# Patient Record
Sex: Male | Born: 1980 | Race: White | Hispanic: No | Marital: Married | Smoking: Former smoker
Health system: Southern US, Community
[De-identification: ages and names within clinical notes are randomized; demographics above are authoritative.]

## PROBLEM LIST (undated history)

## (undated) DIAGNOSIS — F419 Anxiety disorder, unspecified: Secondary | ICD-10-CM

## (undated) DIAGNOSIS — T7840XA Allergy, unspecified, initial encounter: Secondary | ICD-10-CM

## (undated) DIAGNOSIS — J45909 Unspecified asthma, uncomplicated: Secondary | ICD-10-CM

## (undated) DIAGNOSIS — K219 Gastro-esophageal reflux disease without esophagitis: Secondary | ICD-10-CM

## (undated) DIAGNOSIS — Z973 Presence of spectacles and contact lenses: Secondary | ICD-10-CM

## (undated) HISTORY — DX: Anxiety disorder, unspecified: F41.9

## (undated) HISTORY — DX: Allergy, unspecified, initial encounter: T78.40XA

## (undated) HISTORY — DX: Unspecified asthma, uncomplicated: J45.909

---

## 2015-03-08 ENCOUNTER — Ambulatory Visit (INDEPENDENT_AMBULATORY_CARE_PROVIDER_SITE_OTHER): Payer: BLUE CROSS/BLUE SHIELD | Admitting: Family Medicine

## 2015-03-08 ENCOUNTER — Encounter: Payer: Self-pay | Admitting: Family Medicine

## 2015-03-08 VITALS — BP 124/85 | HR 77 | Temp 98.2°F | Ht 71.2 in | Wt 224.9 lb

## 2015-03-08 DIAGNOSIS — F418 Other specified anxiety disorders: Secondary | ICD-10-CM

## 2015-03-08 MED ORDER — CLONAZEPAM 0.5 MG PO TABS: 0.5000 mg | ORAL_TABLET | Freq: Every day | ORAL | Status: DC | PRN

## 2015-03-08 NOTE — Progress Notes (Signed)
BP 124/85 mmHg  Pulse 77  Temp(Src) 98.2 F (36.8 C)  Ht 5' 11.2" (1.808 m)  Wt 224 lb 14.4 oz (102.014 kg)  BMI 31.21 kg/m2  SpO2 98%   Subjective:    Patient ID: Robert Castaneda, male    DOB: 1981-02-21, 33 y.o.   MRN: [id number]  HPI: Robert Castaneda is a 34 y.o. male  Chief Complaint  Patient presents with  . 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, usually about 1x a month Anxious mood: no  Excessive worrying: yes Irritability: yes  Sweating: no Nausea: no Palpitations:no Hyperventilation: no Panic attacks: no Agoraphobia: no  Obscessions/compulsions: no Depressed mood: no GAD7: 7 Anhedonia: no Weight changes: no Insomnia: no   Hypersomnia: no Fatigue/loss of energy: no Feelings of worthlessness: no Feelings of guilt: no Impaired concentration/indecisiveness: no Suicidal ideations: no  Crying spells: no Recent Stressors/Life Changes: yes   Relationship problems: no   Family stress: no     Financial stress: no    Job stress: yes    Recent death/loss: no  Relevant past medical, surgical, family and social history reviewed and updated as indicated. Interim medical history since our last visit reviewed. Allergies and medications reviewed and updated.  Review of Systems  Constitutional: Negative.   Respiratory: Negative.   Cardiovascular: Negative.   Gastrointestinal: Negative.   Psychiatric/Behavioral: Negative.    Per HPI unless specifically indicated above    Objective:    BP 124/85 mmHg  Pulse 77  Temp(Src) 98.2 F (36.8 C)  Ht 5' 11.2" (1.808 m)  Wt 224 lb 14.4 oz (102.014 kg)  BMI 31.21 kg/m2  SpO2 98%  Wt Readings from Last 3 Encounters:  03/08/15 224 lb 14.4 oz (102.014 kg)  03/07/15 209 lb (94.802 kg)    Physical Exam   Constitutional: He is oriented to person, place, and time. He appears well-developed and well-nourished. No distress.  HENT:  Head: Normocephalic and atraumatic.  Right Ear: Hearing normal.  Left Ear: Hearing normal.  Nose: Nose normal.  Eyes: Conjunctivae and lids are normal. Right eye exhibits no discharge. Left eye exhibits no discharge. No scleral icterus.  Cardiovascular: Normal rate, regular rhythm and normal heart sounds.  Exam reveals no gallop and no friction rub.   No murmur heard. Pulmonary/Chest: Effort normal and breath sounds normal. No respiratory distress. He has no wheezes. He has no rales. He exhibits no tenderness.  Musculoskeletal: Normal range of motion.  Neurological: He is alert and oriented to person, place, and time.  Skin: Skin is warm, dry and intact. No rash noted. No erythema. No pallor.  Psychiatric: He has a normal mood and affect. His speech is normal and behavior is normal. Judgment and thought content normal. Cognition and memory are normal.   No results found for this or any previous visit.    Assessment & Plan:   Problem List Items Addressed This Visit      Other   Situational anxiety - Primary    Using his klonopin very appropriately. Taking 0.25mg  about 1x a month and 30 pills lasted him almost 30 months. Refill given today. Continue to monitor. Continue current regimen. Due for PE- to have it done with [facility] PE.           Follow up plan: Return ASAP  for FAA PE and regular PE with [facility].

## 2015-03-08 NOTE — Assessment & Plan Note (Signed)
Using his klonopin very appropriately. Taking 0.25mg  about 1x a month and 30 pills lasted him almost 30 months. Refill given today. Continue to monitor. Continue current regimen. Due for PE- to have it done with [facility] PE.

## 2015-06-30 ENCOUNTER — Encounter: Payer: Self-pay | Admitting: Family Medicine

## 2015-07-18 ENCOUNTER — Ambulatory Visit (INDEPENDENT_AMBULATORY_CARE_PROVIDER_SITE_OTHER): Payer: BLUE CROSS/BLUE SHIELD | Admitting: Family Medicine

## 2015-07-18 ENCOUNTER — Encounter: Payer: Self-pay | Admitting: Family Medicine

## 2015-07-18 VITALS — BP 137/86 | HR 63 | Temp 98.3°F | Ht 71.0 in | Wt 230.0 lb

## 2015-07-18 DIAGNOSIS — H6982 Other specified disorders of Eustachian tube, left ear: Secondary | ICD-10-CM | POA: Diagnosis not present

## 2015-07-18 MED ORDER — PREDNISONE 10 MG PO TABS: 30.0000 mg | ORAL_TABLET | Freq: Every day | ORAL | Status: DC

## 2015-07-18 MED ORDER — FLUTICASONE PROPIONATE 50 MCG/ACT NA SUSP: 2.0000 | Freq: Every day | NASAL | Status: DC

## 2015-07-18 NOTE — Progress Notes (Signed)
BP 137/86 mmHg  Pulse 63  Temp(Src) 98.3 F (36.8 C)  Ht  (1.803 m)  Wt 230 lb (104.327 kg)  BMI 32.09 kg/m2  SpO2 99%   Subjective:    Patient ID: Robert Castaneda, male    DOB: 1980/08/17, 34 y.o.   MRN: [id number]  HPI: Robert Castaneda is a 34 y.o. male  Chief Complaint  Patient presents with  . ear congestion    Left ear, patient states that his ear feels congested, no also has some head congestion   EAG CLOGGED- 3rd time in 5 weeks, feels like a pressure in the L side of his head. Seems to go away after a couple of days, but then comes back. Starts with a buzzing for 12 hours, then feels stuffy and full like cotton balls, then gets really sensitive to sound, then gets really dizzy then goes away the next day. Duration: 5 weeks off and on. Lasts about 3-4 days when it comes on Involved ear(s): yes left Sensation of feeling clogged/plugged: yes Decreased/muffled hearing:yes Ear pain: no Fever: no Otorrhea: no Hearing loss: no Upper respiratory infection symptoms: no Using Q-Tips: no Status: better History of cerumenosis: no Treatments attempted: pseudoephedrine  Relevant past medical, surgical, family and social history reviewed and updated as indicated. Interim medical history since our last visit reviewed. Allergies and medications reviewed and updated.  Review of Systems  Constitutional: Negative.   HENT: Negative.   Respiratory: Negative.   Psychiatric/Behavioral: Negative.     Per HPI unless specifically indicated above     Objective:    BP 137/86 mmHg  Pulse 63  Temp(Src) 98.3 F (36.8 C)  Ht  (1.803 m)  Wt 230 lb (104.327 kg)  BMI 32.09 kg/m2  SpO2 99%  Wt Readings from Last 3 Encounters:  07/18/15 230 lb (104.327 kg)  03/08/15 224 lb 14.4 oz (102.014 kg)  03/07/15 209 lb (94.802 kg)    Physical Exam  Constitutional: He is oriented to person, place, and time. He appears well-developed and well-nourished. No distress.  HENT:   Head: Normocephalic and atraumatic.  Right Ear: Hearing, tympanic membrane, external ear and ear canal normal.  Left Ear: Hearing, external ear and ear canal normal. No lacerations. No drainage, swelling or tenderness. No foreign bodies. No mastoid tenderness. Tympanic membrane is scarred and retracted. Tympanic membrane is not injected, not perforated, not erythematous and not bulging. Tympanic membrane mobility is normal.  No middle ear effusion. No hemotympanum. No decreased hearing is noted.  Nose: Mucosal edema present. No rhinorrhea, nose lacerations, sinus tenderness, nasal deformity, septal deviation or nasal septal hematoma. No epistaxis.  No foreign bodies.  Mouth/Throat: Uvula is midline and mucous membranes are normal. Posterior oropharyngeal edema present. No oropharyngeal exudate, posterior oropharyngeal erythema or tonsillar abscesses.  Eyes: Conjunctivae and lids are normal. Pupils are equal, round, and reactive to light. Right eye exhibits no discharge. Left eye exhibits no discharge. No scleral icterus. Right eye exhibits normal extraocular motion. Left eye exhibits normal extraocular motion.  Bilateral nystagmus with L horizontal gaze  Pulmonary/Chest: Effort normal. No respiratory distress.  Musculoskeletal: Normal range of motion.  Neurological: He is alert and oriented to person, place, and time.  Skin: Skin is intact. No rash noted.  Psychiatric: He has a normal mood and affect. His speech is normal and behavior is normal. Judgment and thought content normal. Cognition and memory are normal.  Nursing note and vitals reviewed.   No results found for this or any  previous visit.    Assessment & Plan:   Problem List Items Addressed This Visit    None    Visit Diagnoses    ETD (eustachian tube dysfunction), left    -  Primary    Will start flonase. Prednisone burst if it happens again. Call if not getting better. Epley's manuver due to slight nystagmus. Monitor.          Follow up plan: Return if symptoms worsen or fail to improve.

## 2015-07-18 NOTE — Patient Instructions (Signed)
Epley Maneuver Self-Care  WHAT IS THE EPLEY MANEUVER?  The Epley maneuver is an exercise you can do to relieve symptoms of benign paroxysmal positional vertigo (BPPV). This condition is often just referred to as vertigo. BPPV is caused by the movement of tiny crystals (canaliths) inside your inner ear. The accumulation and movement of canaliths in your inner ear causes a sudden spinning sensation (vertigo) when you move your head to certain positions. Vertigo usually lasts about 30 seconds. BPPV usually occurs in just one ear. If you get vertigo when you lie on your left side, you probably have BPPV in your left ear. Your health care provider can tell you which ear is involved.   BPPV may be caused by a head injury. Many people older than 50 get BPPV for unknown reasons. If you have been diagnosed with BPPV, your health care provider may teach you how to do this maneuver. BPPV is not life threatening (benign) and usually goes away in time.   WHEN SHOULD I PERFORM THE EPLEY MANEUVER?  You can do this maneuver at home whenever you have symptoms of vertigo. You may do the Epley maneuver up to 3 times a day until your symptoms of vertigo go away.  HOW SHOULD I DO THE EPLEY MANEUVER?  1. Sit on the edge of a bed or table with your back straight. Your legs should be extended or hanging over the edge of the bed or table.    2. Turn your head halfway toward the affected ear.    3. Lie backward quickly with your head turned until you are lying flat on your back. You may want to position a pillow under your shoulders.    4. Hold this position for 30 seconds. You may experience an attack of vertigo. This is normal. Hold this position until the vertigo stops.  5. Then turn your head to the opposite direction until your unaffected ear is facing the floor.    6. Hold this position for 30 seconds. You may experience an attack of vertigo. This is normal. Hold this position until the vertigo stops.  7. Now turn your whole body to  the same side as your head. Hold for another 30 seconds.    8. You can then sit back up.  ARE THERE RISKS TO THIS MANEUVER?  In some cases, you may have other symptoms (such as changes in your vision, weakness, or numbness). If you have these symptoms, stop doing the maneuver and call your health care provider. Even if doing these maneuvers relieves your vertigo, you may still have dizziness. Dizziness is the sensation of light-headedness but without the sensation of movement. Even though the Epley maneuver may relieve your vertigo, it is possible that your symptoms will return within 5 years.  WHAT SHOULD I DO AFTER THIS MANEUVER?  After doing the Epley maneuver, you can return to your normal activities. Ask your doctor if there is anything you should do at home to prevent vertigo. This may include:  · Sleeping with two or more pillows to keep your head elevated.  · Not sleeping on the side of your affected ear.  · Getting up slowly from bed.  · Avoiding sudden movements during the day.  · Avoiding extreme head movement, like looking up or bending over.  · Wearing a cervical collar to prevent sudden head movements.  WHAT SHOULD I DO IF MY SYMPTOMS GET WORSE?  Call your health care provider if your vertigo gets worse. Call your provider right way if   you have other symptoms, including:   · Nausea.  · Vomiting.  · Headache.  · Weakness.  · Numbness.  · Vision changes.     This information is not intended to replace advice given to you by your health care provider. Make sure you discuss any questions you have with your health care provider.     Document Released: 08/03/2013 Document Reviewed: 08/03/2013  Elsevier Interactive Patient Education ©2016 [employer]

## 2015-08-09 ENCOUNTER — Telehealth: Payer: Self-pay | Admitting: Family Medicine

## 2015-08-09 NOTE — Telephone Encounter (Signed)
Pt called stated he would like a call back from Dr. Laural BenesJohnson to follow up from his last appt. Please call him ASAP. Thanks.

## 2015-08-10 NOTE — Telephone Encounter (Signed)
Called LVM for patient to return my call if he stills needs something.

## 2015-08-11 NOTE — Telephone Encounter (Signed)
Please call patient, he would like to discuss his last appointment with you.  Please call at the end of the day

## 2015-08-11 NOTE — Telephone Encounter (Signed)
Called patient. No answer. Will call back in a little bit. LMOM.

## 2015-08-11 NOTE — Telephone Encounter (Signed)
Called patient. He notes that he had a little bit of congestion that came back. Took the prednisone and it kicked it out. He notes that it cleared up and didn't cause the problems that it had done again. Discussed ETD with patient. Patient states that the prednisone made him feel really great. Felt normal and felt like that he misses it. Would like to have his CBC, TSH, testosterone checked next time he comes in. Will call him in 2 weeks to schedule follow up appointment [place]

## 2015-08-30 ENCOUNTER — Encounter: Payer: BLUE CROSS/BLUE SHIELD | Admitting: Family Medicine

## 2015-09-19 ENCOUNTER — Ambulatory Visit (INDEPENDENT_AMBULATORY_CARE_PROVIDER_SITE_OTHER): Payer: BLUE CROSS/BLUE SHIELD | Admitting: Family Medicine

## 2015-09-19 ENCOUNTER — Encounter: Payer: Self-pay | Admitting: Family Medicine

## 2015-09-19 VITALS — BP 143/89 | HR 93 | Temp 99.9°F | Wt 234.0 lb

## 2015-09-19 DIAGNOSIS — R1030 Lower abdominal pain, unspecified: Secondary | ICD-10-CM | POA: Diagnosis not present

## 2015-09-19 LAB — UA/M W/RFLX CULTURE, ROUTINE
Bilirubin, UA: NEGATIVE
Glucose, UA: NEGATIVE
KETONES UA: NEGATIVE
Leukocytes, UA: NEGATIVE
NITRITE UA: NEGATIVE
Protein, UA: NEGATIVE
RBC, UA: NEGATIVE
UUROB: 0.2 mg/dL (ref 0.2–1.0)
pH, UA: 6.5 (ref 5.0–7.5)

## 2015-09-19 NOTE — Progress Notes (Signed)
BP 148/81 mmHg  Pulse 97  Temp(Src) 99.9 F (37.7 C)  Wt 234 lb (106.142 kg)  SpO2 100%   Subjective:    Patient ID: Robert Castaneda, male    DOB: 21-Feb-1981, 35 y.o.   MRN: [id number]  HPI: Robert Castaneda is a 35 y.o. male  Chief Complaint  Patient presents with  . Abdominal Pain    X 1 week, no vomiting or diarrhea until today, which his stool has been about half and half. He states that he did have some chills last week.   ABDOMINAL PAIN  Duration: About a week Onset: sudden Severity: moderate Quality: cramping Location:  suprapubic". "lower abdominal quadrants  Episode duration: couple seconds Radiation: no Frequency: once an hour Alleviating factors: passing gas, laying down, bowel movement, positional, advil Aggravating factors: positioning Status: better Treatments attempted: pepto bismol, imodium, tums, advil Fever: no, + chills Nausea: no Vomiting: no Weight loss: no Decreased appetite: yes Diarrhea: no Constipation: no Blood in stool: no Heartburn: no Jaundice: no Rash: no Dysuria/urinary frequency: no Hematuria: no History of sexually transmitted disease: no Recurrent NSAID use: no  Relevant past medical, surgical, family and social history reviewed and updated as indicated. Interim medical history since our last visit reviewed. Allergies and medications reviewed and updated.  Review of Systems  Constitutional: Negative.   Respiratory: Negative.   Cardiovascular: Negative.   Gastrointestinal: Positive for abdominal pain. Negative for nausea, vomiting, diarrhea, constipation, blood in stool, abdominal distention, anal bleeding and rectal pain.  Genitourinary: Negative.   Skin: Negative.   Psychiatric/Behavioral: Negative.     Per HPI unless specifically indicated above     Objective:    BP 148/81 mmHg  Pulse 97  Temp(Src) 99.9 F (37.7 C)  Wt 234 lb (106.142 kg)  SpO2 100%  Wt Readings from Last 3 Encounters:  09/19/15 234 lb  (106.142 kg)  07/18/15 230 lb (104.327 kg)  03/08/15 224 lb 14.4 oz (102.014 kg)    Physical Exam  Constitutional: He is oriented to person, place, and time. He appears well-developed and well-nourished. No distress.  HENT:  Head: Normocephalic and atraumatic.  Right Ear: Hearing normal.  Left Ear: Hearing normal.  Nose: Nose normal.  Eyes: Conjunctivae and lids are normal. Right eye exhibits no discharge. Left eye exhibits no discharge. No scleral icterus.  Cardiovascular: Normal rate, regular rhythm, normal heart sounds and intact distal pulses.  Exam reveals no gallop and no friction rub.   No murmur heard. Pulmonary/Chest: Effort normal and breath sounds normal. No respiratory distress. He has no wheezes. He has no rales. He exhibits no tenderness.  Abdominal: Soft. He exhibits no distension and no mass. Bowel sounds are increased. There is no tenderness. There is no rebound and no guarding.  Musculoskeletal: Normal range of motion.  Neurological: He is alert and oriented to person, place, and time.  Skin: Skin is intact. No rash noted.  Psychiatric: He has a normal mood and affect. His speech is normal and behavior is normal. Judgment and thought content normal. Cognition and memory are normal.  Nursing note and vitals reviewed.   No results found for this or any previous visit.    Assessment & Plan:   Problem List Items Addressed This Visit    None    Visit Diagnoses    Suprapubic pain, unspecified laterality    -  Primary    Checking UA today. Likely due to GI bug- gentle diet. Continue to monitor. Let us know if not getting  better or getting worse.     Relevant Orders    UA/M w/rflx Culture, Routine        Follow up plan: Return if symptoms worsen or fail to improve.

## 2015-09-19 NOTE — Patient Instructions (Signed)

## 2015-09-22 ENCOUNTER — Telehealth: Payer: Self-pay

## 2015-09-22 NOTE — Telephone Encounter (Signed)
Tried to call patient, he was finishing up at a meeting, he will try to call us back.

## 2015-09-22 NOTE — Telephone Encounter (Signed)
Please find out what he wants. If he would like to discuss testosterone testing, he will need an appointment.

## 2015-09-22 NOTE — Telephone Encounter (Signed)
Patient called, he would like to follow up with you regarding his appointment earlier this week.

## 2015-09-25 NOTE — Telephone Encounter (Signed)
Spoke with patient he is still having the stomach pain, but he has been taking Advil which has been keeping the pain under control.

## 2017-11-20 ENCOUNTER — Emergency Department (HOSPITAL_COMMUNITY)
Admission: EM | Admit: 2017-11-20 | Discharge: 2017-11-21 | Disposition: A | Discharge status: Home / Self Care | Payer: BLUE CROSS/BLUE SHIELD | Attending: Emergency Medicine | Admitting: Emergency Medicine

## 2017-11-20 ENCOUNTER — Encounter (HOSPITAL_COMMUNITY): Payer: Self-pay

## 2017-11-20 DIAGNOSIS — Y939 Activity, unspecified: Secondary | ICD-10-CM | POA: Diagnosis not present

## 2017-11-20 DIAGNOSIS — T18128A Food in esophagus causing other injury, initial encounter: Secondary | ICD-10-CM | POA: Insufficient documentation

## 2017-11-20 DIAGNOSIS — Z79899 Other long term (current) drug therapy: Secondary | ICD-10-CM | POA: Insufficient documentation

## 2017-11-20 DIAGNOSIS — Y999 Unspecified external cause status: Secondary | ICD-10-CM | POA: Diagnosis not present

## 2017-11-20 DIAGNOSIS — X58XXXA Exposure to other specified factors, initial encounter: Secondary | ICD-10-CM | POA: Insufficient documentation

## 2017-11-20 DIAGNOSIS — J45909 Unspecified asthma, uncomplicated: Secondary | ICD-10-CM | POA: Insufficient documentation

## 2017-11-20 DIAGNOSIS — Y929 Unspecified place or not applicable: Secondary | ICD-10-CM | POA: Insufficient documentation

## 2017-11-20 NOTE — ED Triage Notes (Signed)
Pt states that he feels like he has a piece of steak stuck in his throat, airway intact, no distress, able to swallow secretions.

## 2017-11-21 ENCOUNTER — Emergency Department (HOSPITAL_COMMUNITY): Payer: BLUE CROSS/BLUE SHIELD

## 2017-11-21 MED ORDER — GLUCAGON HCL RDNA (DIAGNOSTIC) 1 MG IJ SOLR
1.0000 mg | Freq: Once | INTRAMUSCULAR | Status: AC
  Administered 2017-11-21: 1 mg via INTRAVENOUS
  Filled 2017-11-21: qty 1

## 2017-11-21 MED ORDER — LORAZEPAM 2 MG/ML IJ SOLN
1.0000 mg | Freq: Once | INTRAMUSCULAR | Status: AC
  Administered 2017-11-21: 1 mg via INTRAVENOUS
  Filled 2017-11-21: qty 1

## 2017-11-21 NOTE — ED Provider Notes (Signed)
[facility] EMERGENCY DEPARTMENT Provider Note   CSN: [id number] Arrival date & time: 11/20/17  2259     History   Chief Complaint Chief Complaint  Patient presents with  . Swallowed Foreign Body    HPI Robert Castaneda is a 37 y.o. male.  Patient is a 37 year old male with no significant past medical history.  He presents today for evaluation of esophageal foreign body.  He states he was eating steak yesterday when he believes it became stuck in his esophagus.  He has been unable to eat or drink since.  He reports spitting his saliva into a cup.  He denies abdominal pain.  He denies any difficulty breathing.  The history is provided by the patient.  Swallowed Foreign Body  This is a new problem. The problem occurs constantly. The problem has not changed since onset.Nothing aggravates the symptoms. Nothing relieves the symptoms.    Past Medical History:  Diagnosis Date  . Anxiety    Social   . Asthma     Patient Active Problem List   Diagnosis Date Noted  . Situational anxiety 03/08/2015    History reviewed. No pertinent surgical history.      Home Medications    Prior to Admission medications   Medication Sig Start Date End Date Taking? Authorizing Provider  clonazePAM (KLONOPIN) 0.5 MG tablet Take 1 tablet (0.5 mg total) by mouth daily as needed for anxiety. 03/08/15   Johnson, Megan P, DO  fluticasone (FLONASE) 50 MCG/ACT nasal spray Place 2 sprays into both nostrils daily. 07/18/15   Dorcas Carrow, DO    Family History Family History  Problem Relation Age of Onset  . Arthritis Mother   . Diabetes Father   . Cancer Maternal Grandmother        breast  . Alzheimer's disease Paternal Grandmother   . Cancer Paternal Uncle        lung ans prostate    Social History Social History   Tobacco Use  . Smoking status: Never Smoker  . Smokeless tobacco: Never Used  Substance Use Topics  . Alcohol use: Yes    Alcohol/week: 4.8 oz   Types: 8 Cans of beer per week    Comment: on the weekends occasionally  . Drug use: No     Allergies   Apple and Carrot [daucus carota]   Review of Systems Review of Systems  All other systems reviewed and are negative.    Physical Exam Updated Vital Signs BP (!) 143/91 (BP Location: Right Arm)   Pulse 91   Temp 98.7 F (37.1 C) (Oral)   Resp 14   Ht 5\' 11"  (1.803 m)   Wt 102.1 kg (225 lb)   SpO2 100%   BMI 31.38 kg/m   Physical Exam  Constitutional: He is oriented to person, place, and time. He appears well-developed and well-nourished. No distress.  HENT:  Head: Normocephalic and atraumatic.  Mouth/Throat: Oropharynx is clear and moist.  Neck: Normal range of motion. Neck supple.  Cardiovascular: Normal rate and regular rhythm. Exam reveals no friction rub.  No murmur heard. Pulmonary/Chest: Effort normal and breath sounds normal. No respiratory distress. He has no wheezes. He has no rales.  Abdominal: Soft. Bowel sounds are normal. He exhibits no distension. There is no tenderness.  Musculoskeletal: Normal range of motion. He exhibits no edema.  Neurological: He is alert and oriented to person, place, and time. Coordination normal.  Skin: Skin is warm and dry. He is not  diaphoretic.  Nursing note and vitals reviewed.    ED Treatments / Results  Labs (all labs ordered are listed, but only abnormal results are displayed) Labs Reviewed - No data to display  EKG None  Radiology Dg Chest 2 View  Result Date: 11/21/2017 CLINICAL DATA:  Patient feels like he has a piece of steak stuck in his throat. EXAM: CHEST - 2 VIEW COMPARISON:  None. FINDINGS: The heart size and mediastinal contours are within normal limits. Dilatation of the esophagus identified. No radiopaque foreign body is noted. Both lungs are clear. The visualized skeletal structures are unremarkable. IMPRESSION: No active cardiopulmonary disease. Radiopaque foreign body. No esophageal dilatation.  Electronically Signed   By: Tollie Ethavid  Kwon M.D.   On: 11/21/2017 01:45    Procedures Procedures (including critical care time)  Medications Ordered in ED Medications  glucagon (human recombinant) (GLUCAGEN) injection 1 mg (has no administration in time range)  LORazepam (ATIVAN) injection 1 mg (has no administration in time range)     Initial Impression / Assessment and Plan / ED Course  I have reviewed the triage vital signs and the nursing notes.  Pertinent labs & imaging results that were available during my care of the patient were reviewed by me and considered in my medical decision making (see chart for details).  Patient presents with steak bolus in the esophagus.  He was given Ativan and glucagon followed by [employer] with good results.  He is now drinking without difficulty.  He feels much better.  He will be discharged, to return as needed for any problems.  Final Clinical Impressions(s) / ED Diagnoses   Final diagnoses:  None    ED Discharge Orders    None       Geoffery Lyonselo, Juwann Sherk, MD 11/21/17 [phone]

## 2017-11-21 NOTE — Discharge Instructions (Addendum)
Return to the emergency department if you experience any new or concerning symptoms. °

## 2019-11-11 ENCOUNTER — Telehealth: Payer: Self-pay

## 2019-11-11 DIAGNOSIS — F418 Other specified anxiety disorders: Secondary | ICD-10-CM

## 2019-11-11 NOTE — Telephone Encounter (Signed)
Copied from CRM [id number]. Topic: General - Other >> Nov 11, 2019  3:12 PM Fanny Bien wrote: Reason for CRM: pt called and stated that pt wife lori had transferred over to Dr Sullivan Lone and would like to know if he would take him on as a Pt as well. Please advise

## 2019-11-12 NOTE — Telephone Encounter (Signed)
Please advise 

## 2019-11-16 ENCOUNTER — Telehealth: Payer: Self-pay | Admitting: Family Medicine

## 2019-11-16 NOTE — Telephone Encounter (Signed)
Patient requesting clonazePAM (KLONOPIN) 0.5 MG tablet to hold him over until NPA with Dr. Sullivan Lone for  12/07/2019. Patient aware its been a several years since last visit but states the last time PCP prescribed has held him over until now, he's completely out, please advise  [id number] [place], [place] - [address] Phone:  [phone]  Fax:  [phone]

## 2019-11-16 NOTE — Telephone Encounter (Signed)
Patient has not been seen here in over 4 years, he is not a current patient at this office.

## 2019-11-17 NOTE — Telephone Encounter (Signed)
Appointment has been made, per patient he spoke with you and you said you would accept him.

## 2019-11-18 NOTE — Telephone Encounter (Signed)
Ok thx.

## 2019-11-22 ENCOUNTER — Other Ambulatory Visit: Payer: Self-pay | Admitting: Family Medicine

## 2019-11-22 NOTE — Telephone Encounter (Signed)
Routing to provider  

## 2019-11-22 NOTE — Telephone Encounter (Signed)
Copied from CRM [id number]. Topic: Quick Communication - Rx Refill/Question >> Nov 22, 2019  2:30 PM Dalphine Handing A wrote: Medication: clonazePAM (KLONOPIN) 0.5 MG tablet  Has the patient contacted their pharmacy? {Yes (Agent: If no, request that the patient contact the pharmacy for the refill.) (Agent: If yes, when and what did the pharmacy advise?)Contact PCP  Preferred Pharmacy (with phone number or street name): [id number] - [place], [place] [address]  Phone:  [phone] Fax:  [phone]     Agent: Please be advised that RX refills may take up to 3 business days. We ask that you follow-up with your pharmacy.

## 2019-11-23 NOTE — Telephone Encounter (Signed)
Pt called asking if Dr. Sullivan Lone would consider refilling his Klonipin for him before he sees him or if he could be worked in sooner.  He siad [facility] cannot see him until May and they will not refill it.   He was seeing Dr. Olevia Perches   CB#  [phone]

## 2019-11-23 NOTE — Telephone Encounter (Signed)
He is not a current patient. I cannot give him a refill.   FYI to [place]

## 2019-11-23 NOTE — Telephone Encounter (Signed)
Called pt back advised that he would have to reestablish and Dr Laural Benes is not accepting new pt's at the time offered to schedule with another provider in office he wants to keep his appt for 4/27 else where. Advised of 3 year policy he states that he only wants a refill

## 2019-11-23 NOTE — Telephone Encounter (Signed)
Pt is aware of the below message. Pt would like to know if dr Laural Benes can give him clonazepam enough to last until he sees dr Sullivan Lone

## 2019-11-25 MED ORDER — CLONAZEPAM 0.5 MG PO TABS: 0.5000 mg | ORAL_TABLET | Freq: Every day | ORAL | 0 refills | Status: DC | PRN

## 2019-11-25 NOTE — Addendum Note (Signed)
Addended by: Bosie Clos on: 11/25/2019 12:00 PM   Modules accepted: Orders

## 2019-11-25 NOTE — Telephone Encounter (Signed)
Patient advised.

## 2019-11-25 NOTE — Telephone Encounter (Signed)
Let patient know prescription has been sent in and I will see him in May.

## 2019-11-30 IMAGING — DX DG CHEST 2V
2 series · 2 of 2 positions shown · non-contrast
Comparison: None.

CLINICAL DATA: Patient feels like he has a piece of steak stuck in
his throat.

EXAM:
CHEST - 2 VIEW

[chest pa]
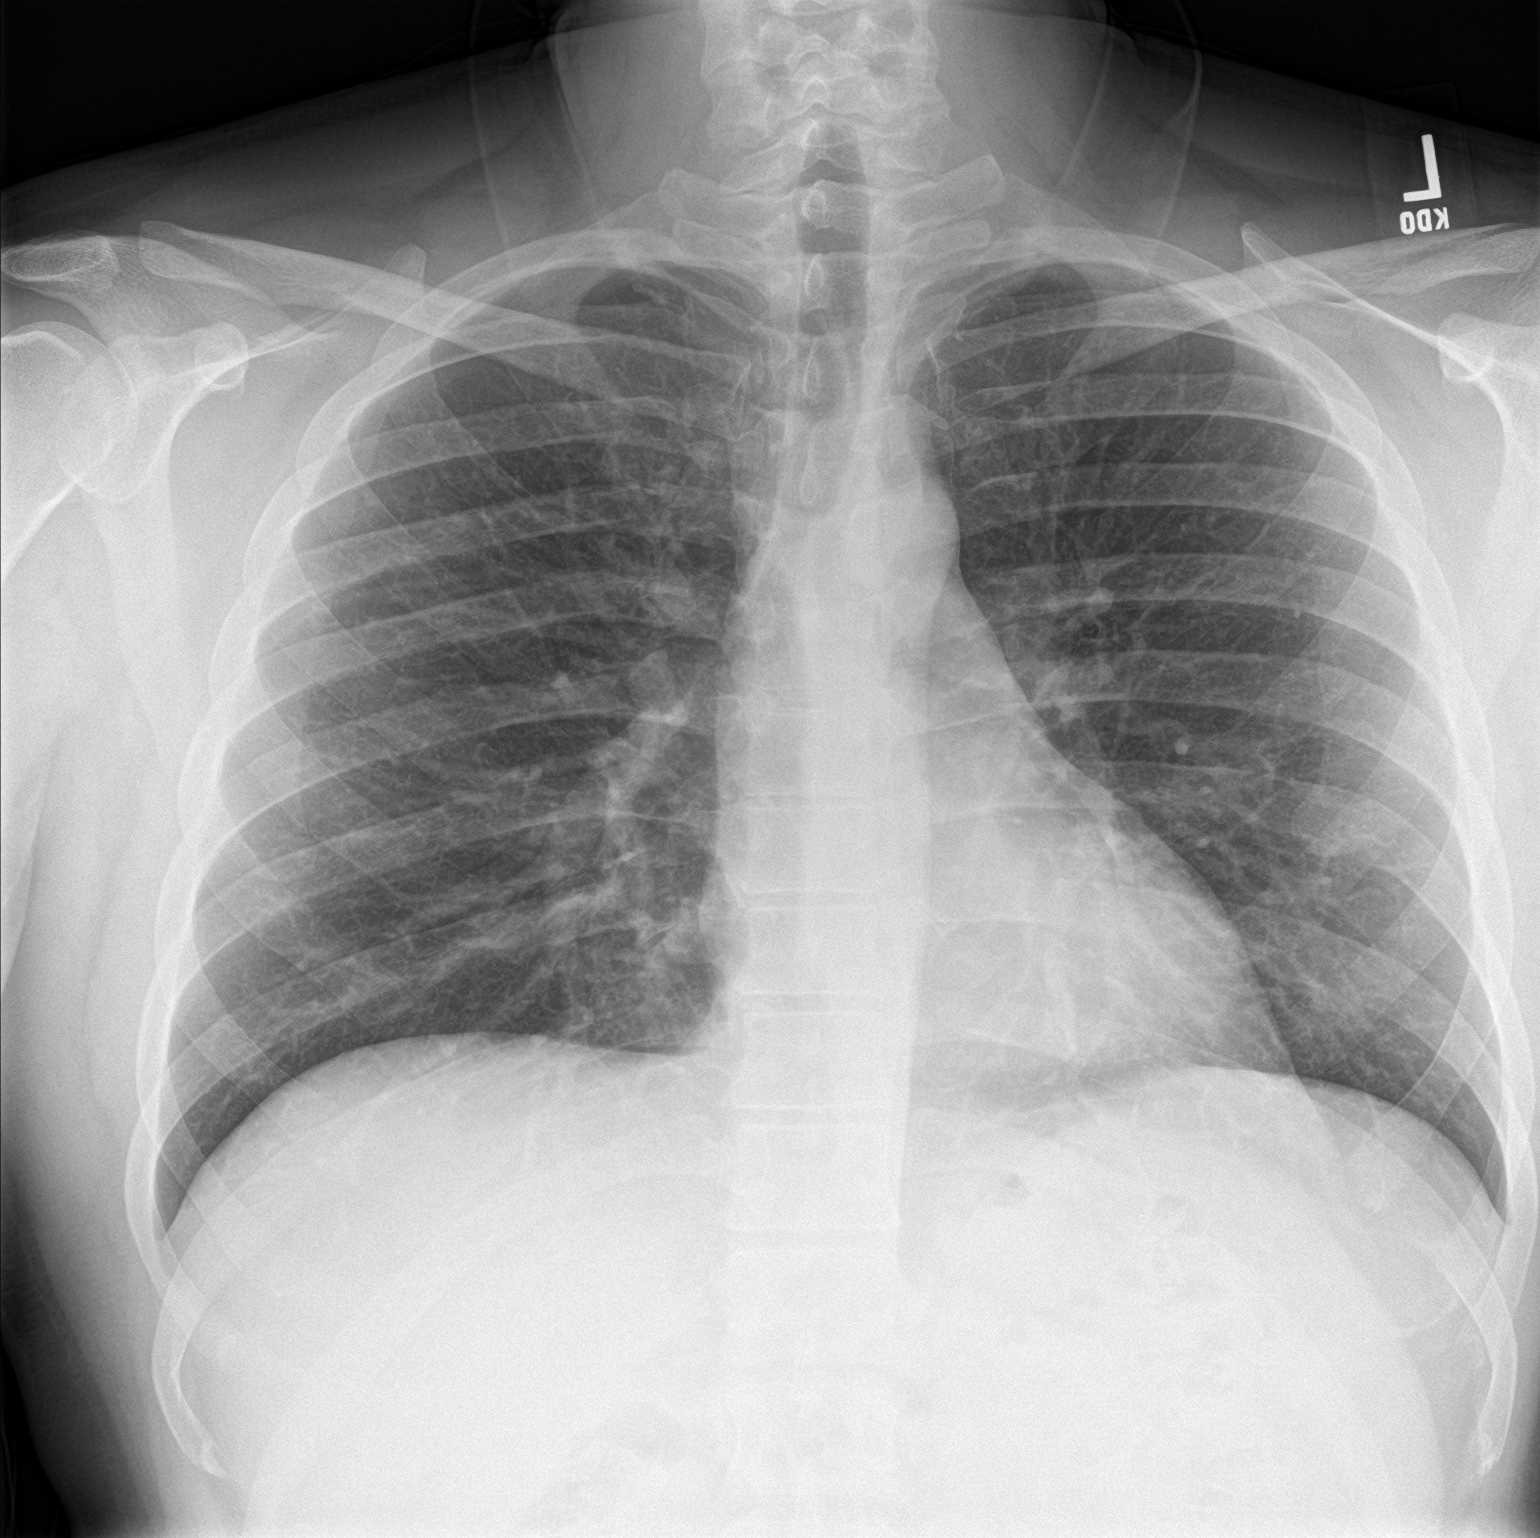

[chest lat]
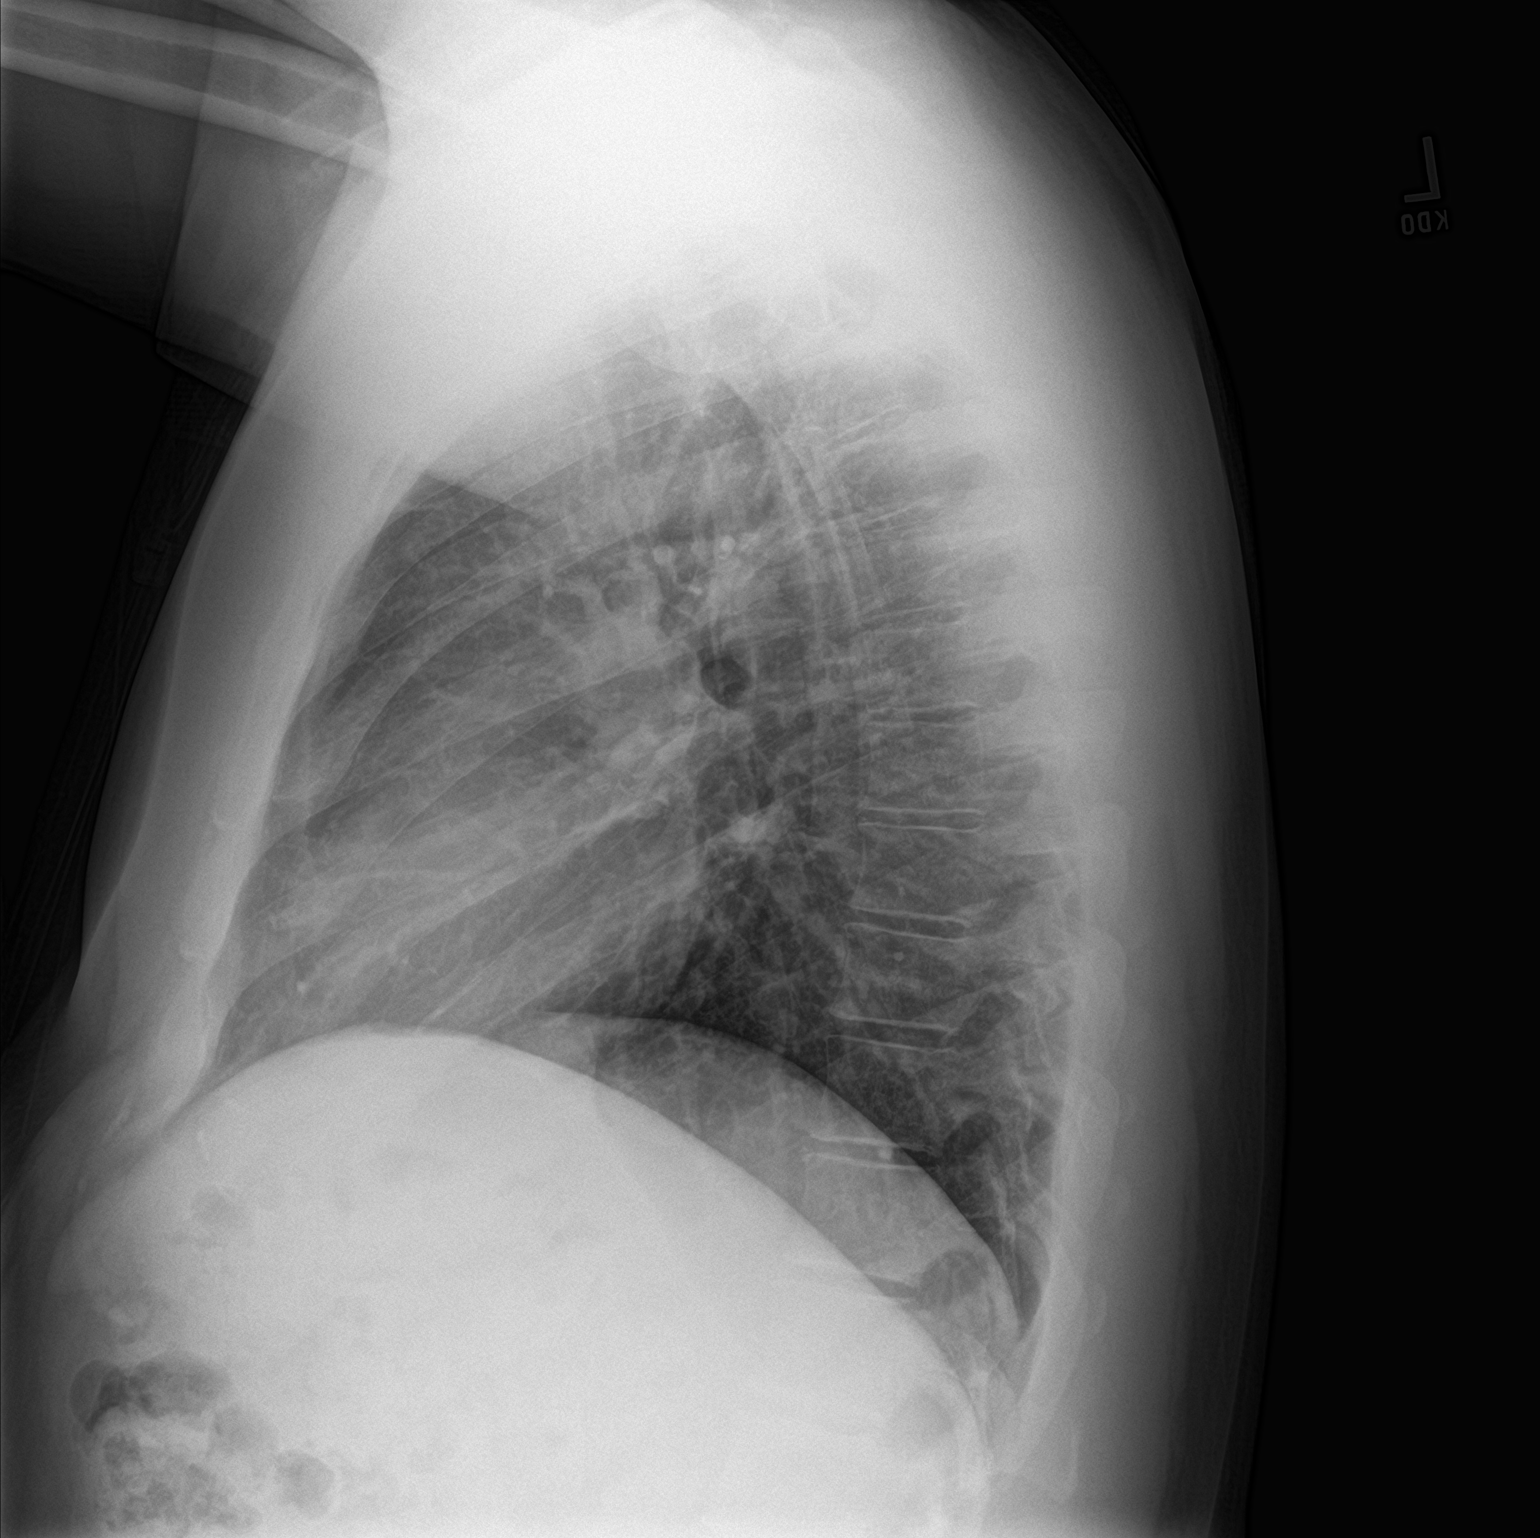

[2 of 2 positions shown; findings below may reference images not displayed]

FINDINGS: The heart size and mediastinal contours are within normal limits.
Dilatation of the esophagus identified. No radiopaque foreign body
is noted. Both lungs are clear. The visualized skeletal structures
are unremarkable.
IMPRESSION: No active cardiopulmonary disease. Radiopaque foreign body. No
esophageal dilatation.

## 2019-12-03 NOTE — Progress Notes (Signed)
New patient visit   I,April Miller,acting as a scribe for Wilhemena Durie, MD.,have documented all relevant documentation on the behalf of Wilhemena Durie, MD,as directed by  Wilhemena Durie, MD while in the presence of Wilhemena Durie, MD.   Patient: Robert Castaneda   DOB: [birth year]   39 y.o. Male  MRN: [id number] Visit Date: 12/07/2019  Today's healthcare provider: Wilhemena Durie, MD   Chief Complaint  Patient presents with  . Establish Care   Subjective    Robert Castaneda is a 39 y.o. male who presents today as a new patient to establish care.  HPI  Patient has not had an annual physical in a while.  We will perform one today. He is married and the father of 2 daughters ages 39 and 2.  He owns his own business.  He has been on the keto diet since January 1 and has lost 30 pounds with that.  He feels well.  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 At this time he does not get regular exercise.  Past Medical History:  Diagnosis Date  . Allergy   . Anxiety    Social   . Asthma    History reviewed. No pertinent surgical history. Family Status  Relation Name Status  . Mother  Alive  . Father  Alive  . Sister  Alive  . MGM  Deceased  . MGF  Deceased  . PGM  Deceased  . PGF  Deceased  . Annamarie Major  (Not Specified)   Family History  Problem Relation Age of Onset  . Arthritis Mother   . Allergies Mother   . Diabetes Father   . Cancer Maternal Grandmother        breast  . Alzheimer's disease Paternal Grandmother   . Cancer Paternal Uncle        lung ans prostate   Social History   Socioeconomic History  . Marital status: Married    Spouse name: Not on file  . Number of children: Not on file  . Years of education: Not on file  . Highest education level: Not on file  Occupational History  . Not on file  Tobacco Use  . Smoking status: Never Smoker  . Smokeless tobacco: Never Used  Substance and Sexual Activity  . Alcohol  use: Yes    Alcohol/week: 8.0 standard drinks    Types: 8 Cans of beer per week    Comment: on the weekends occasionally  . Drug use: No  . Sexual activity: Yes  Other Topics Concern  . Not on file  Social History Narrative  . Not on file   Social Determinants of Health   Financial Resource Strain:   . Difficulty of Paying Living Expenses:   Food Insecurity:   . Worried About Charity fundraiser in the Last Year:   . Arboriculturist in the Last Year:   Transportation Needs:   . Film/video editor (Medical):   Marland Kitchen Lack of Transportation (Non-Medical):   Physical Activity:   . Days of Exercise per Week:   . Minutes of Exercise per Session:   Stress:   . Feeling of Stress :   Social Connections:   . Frequency of Communication with Friends and Family:   . Frequency of Social Gatherings with Friends and Family:   . Attends Religious Services:   . Active Member of Clubs or Organizations:   .  Attends Banker Meetings:   Marland Kitchen Marital Status:    Outpatient Medications Prior to Visit  Medication Sig  . clonazePAM (KLONOPIN) 0.5 MG tablet Take 1 tablet (0.5 mg total) by mouth daily as needed for anxiety.  . fluticasone (FLONASE) 50 MCG/ACT nasal spray Place 2 sprays into both nostrils daily. (Patient not taking: Reported on 12/07/2019)   No facility-administered medications prior to visit.   Allergies  Allergen Reactions  . Apple Swelling  . Carrot [Daucus Carota]     Pain     Patient Care Team: Maple Hudson., MD as PCP - General (Family Medicine)  Review of Systems  Constitutional: Negative.   HENT: Positive for congestion, rhinorrhea and sneezing.   Eyes: Negative.   Respiratory: Positive for chest tightness.   Cardiovascular: Negative.   Gastrointestinal: Negative.   Endocrine: Negative.   Genitourinary: Negative.   Musculoskeletal: Negative.   Allergic/Immunologic: Positive for environmental allergies and food allergies.  Neurological:  Negative.   Hematological: Negative.   Psychiatric/Behavioral: The patient is nervous/anxious.        Objective    BP 134/80 (BP Location: Left Arm, Patient Position: Sitting, Cuff Size: Large)   Pulse 88   Temp (!) 97.3 F (36.3 C) (Other (Comment))   Resp 16   Ht 5\' 11"  (1.803 m)   Wt 221 lb (100.2 kg)   SpO2 97%   BMI 30.82 kg/m  Physical Exam Vitals reviewed.  HENT:     Head: Normocephalic and atraumatic.     Right Ear: Tympanic membrane and external ear normal.     Left Ear: Tympanic membrane and external ear normal.     Nose: Nose normal.     Mouth/Throat:     Mouth: Mucous membranes are moist.     Pharynx: Oropharynx is clear.  Eyes:     General: No scleral icterus.    Conjunctiva/sclera: Conjunctivae normal.     Pupils: Pupils are equal, round, and reactive to light.  Cardiovascular:     Rate and Rhythm: Normal rate and regular rhythm.     Pulses: Normal pulses.     Heart sounds: Normal heart sounds.  Pulmonary:     Effort: Pulmonary effort is normal.     Breath sounds: Normal breath sounds.  Abdominal:     Palpations: Abdomen is soft.  Genitourinary:    Penis: Normal.      Testes: Normal.  Musculoskeletal:     Right lower leg: No edema.     Left lower leg: No edema.  Lymphadenopathy:     Cervical: No cervical adenopathy.  Skin:    Findings: No lesion or rash.  Neurological:     General: No focal deficit present.     Mental Status: He is alert and oriented to person, place, and time.  Psychiatric:        Mood and Affect: Mood normal.        Behavior: Behavior normal.        Thought Content: Thought content normal.        Judgment: Judgment normal.     ECG reveals normal sinus rhythm with no ischemic changes  Results for orders placed or performed in visit on 12/07/19  POCT urinalysis dipstick  Result Value Ref Range   Color, UA Yellow    Clarity, UA Clear    Glucose, UA Negative Negative   Bilirubin, UA Negative    Ketones, UA Negative      Spec Grav, UA 1.010 1.010 -  1.025   Blood, UA Negative    pH, UA 6.0 5.0 - 8.0   Protein, UA Negative Negative   Urobilinogen, UA 0.2 0.2 or 1.0 E.U./dL   Nitrite, UA Negative    Leukocytes, UA Negative Negative   Appearance Normal    Odor Normal     Assessment & Plan     1. Annual physical exam Return in 1 year - EKG 12-Lead - POCT urinalysis dipstick--Normal. - CBC w/Diff/Platelet - Comprehensive Metabolic Panel (CMET) - TSH - Lipid panel - PSA  2. Situational anxiety Follow clinically.  Patient wishes to avoid meds. - CBC w/Diff/Platelet - Comprehensive Metabolic Panel (CMET) - TSH - Lipid panel  3. Screening for prostate cancer  - CBC w/Diff/Platelet - Comprehensive Metabolic Panel (CMET) - TSH - Lipid panel - PSA   No follow-ups on file.     I, Megan Mans, MD, have reviewed all documentation for this visit. The documentation on 12/10/19 for the exam, diagnosis, procedures, and orders are all accurate and complete.    Chauna Osoria Wendelyn Breslow, MD  [facility] [phone] (phone) [phone] (fax)  [facility]

## 2019-12-07 ENCOUNTER — Encounter: Payer: Self-pay | Admitting: Family Medicine

## 2019-12-07 ENCOUNTER — Other Ambulatory Visit: Payer: Self-pay

## 2019-12-07 ENCOUNTER — Ambulatory Visit (INDEPENDENT_AMBULATORY_CARE_PROVIDER_SITE_OTHER): Payer: BLUE CROSS/BLUE SHIELD | Admitting: Family Medicine

## 2019-12-07 VITALS — BP 134/80 | HR 88 | Temp 97.3°F | Resp 16 | Ht 71.0 in | Wt 221.0 lb

## 2019-12-07 DIAGNOSIS — Z125 Encounter for screening for malignant neoplasm of prostate: Secondary | ICD-10-CM

## 2019-12-07 DIAGNOSIS — F418 Other specified anxiety disorders: Secondary | ICD-10-CM

## 2019-12-07 DIAGNOSIS — Z0000 Encounter for general adult medical examination without abnormal findings: Secondary | ICD-10-CM

## 2019-12-07 LAB — POCT URINALYSIS DIPSTICK
Appearance: NORMAL
Bilirubin, UA: NEGATIVE
Blood, UA: NEGATIVE
Glucose, UA: NEGATIVE
Ketones, UA: NEGATIVE
Leukocytes, UA: NEGATIVE
Nitrite, UA: NEGATIVE
Odor: NORMAL
Protein, UA: NEGATIVE
Spec Grav, UA: 1.01 (ref 1.010–1.025)
Urobilinogen, UA: 0.2 E.U./dL
pH, UA: 6 (ref 5.0–8.0)

## 2019-12-10 ENCOUNTER — Telehealth: Payer: Self-pay | Admitting: Family Medicine

## 2019-12-10 DIAGNOSIS — J302 Other seasonal allergic rhinitis: Secondary | ICD-10-CM

## 2019-12-10 NOTE — Telephone Encounter (Signed)
Medication not on current list. Please advise.  

## 2019-12-10 NOTE — Telephone Encounter (Signed)
Medication Refill - Medication: Singulair   Has the patient contacted their pharmacy? Yes.  Pt states that he has run out of this medication. Please advise.  (Agent: If no, request that the patient contact the pharmacy for the refill.) (Agent: If yes, when and what did the pharmacy advise?)  Preferred Pharmacy (with phone number or street name):  [id number] [place] [place] - [address]  [address] [place] [address]  Phone: [phone] Fax: [phone]  Not a 24 hour pharmacy; exact hours not known.     Agent: Please be advised that RX refills may take up to 3 business days. We ask that you follow-up with your pharmacy.

## 2019-12-13 MED ORDER — MONTELUKAST SODIUM 10 MG PO TABS: 10.0000 mg | ORAL_TABLET | Freq: Every day | ORAL | 11 refills | Status: DC

## 2019-12-13 NOTE — Telephone Encounter (Signed)
Refill sent to pharmacy for patient.

## 2019-12-13 NOTE — Telephone Encounter (Signed)
Add to the list.  Okay to send in this with a year refill

## 2019-12-14 MED ORDER — MONTELUKAST SODIUM 10 MG PO TABS: 10.0000 mg | ORAL_TABLET | Freq: Every day | ORAL | 11 refills | Status: DC

## 2019-12-14 NOTE — Telephone Encounter (Signed)
Rx sent 

## 2019-12-14 NOTE — Telephone Encounter (Signed)
Pt is requesting rx for Singulair [facility]. Stated he is no longer in [place].  [id number] [place], [place] - [address] Phone:  [phone]  Fax:  [phone]

## 2019-12-14 NOTE — Addendum Note (Signed)
Addended by: Marlene Lard on: 12/14/2019 08:33 AM   Modules accepted: Orders

## 2019-12-24 ENCOUNTER — Other Ambulatory Visit: Payer: Self-pay | Admitting: Family Medicine

## 2019-12-24 DIAGNOSIS — F418 Other specified anxiety disorders: Secondary | ICD-10-CM

## 2019-12-24 MED ORDER — CLONAZEPAM 0.5 MG PO TABS: 0.5000 mg | ORAL_TABLET | Freq: Every day | ORAL | 0 refills | Status: DC | PRN

## 2019-12-24 NOTE — Telephone Encounter (Signed)
Copied from CRM [id number]. Topic: Quick Communication - Rx Refill/Question >> Dec 24, 2019  8:34 AM Jaquita Rector A wrote: Medication: clonazePAM (KLONOPIN) 0.5 MG tablet   Has the patient contacted their pharmacy? Yes.   (Agent: If no, request that the patient contact the pharmacy for the refill.) (Agent: If yes, when and what did the pharmacy advise?)  Preferred Pharmacy (with phone number or street name): [id number] [place], [place] [address]  Phone:  [phone] Fax:  [phone]     Agent: Please be advised that RX refills may take up to 3 business days. We ask that you follow-up with your pharmacy.

## 2019-12-24 NOTE — Telephone Encounter (Signed)
Requested medication (s) are due for refill today -yes  Requested medication (s) are on the active medication list -yes  Future visit scheduled -no  Last refill: 11/25/19  Notes to clinic: Request for non delegated Rx  Requested Prescriptions  Pending Prescriptions Disp Refills   clonazePAM (KLONOPIN) 0.5 MG tablet [Pharmacy Med Name: CLONAZEPAM 0.5 MG TABLET] 30 tablet 0    Sig: TAKE 1 TABLET BY MOUTH EVERY DAY AS NEEDED FOR ANXIETY      Not Delegated - Psychiatry:  Anxiolytics/Hypnotics Failed - 12/24/2019  8:31 AM      Failed - This refill cannot be delegated      Failed - Urine Drug Screen completed in last 360 days.      Passed - Valid encounter within last 6 months    Recent Outpatient Visits           2 weeks ago Annual physical exam   [facility] Maple Hudson., MD   4 years ago Suprapubic pain, unspecified laterality   [facility] [place], [place], DO   4 years ago ETD (eustachian tube dysfunction), left   [facility] [place], [place], DO   4 years ago Situational anxiety   [facility] [place], [place], DO                  Requested Prescriptions  Pending Prescriptions Disp Refills   clonazePAM (KLONOPIN) 0.5 MG tablet [Pharmacy Med Name: CLONAZEPAM 0.5 MG TABLET] 30 tablet 0    Sig: TAKE 1 TABLET BY MOUTH EVERY DAY AS NEEDED FOR ANXIETY      Not Delegated - Psychiatry:  Anxiolytics/Hypnotics Failed - 12/24/2019  8:31 AM      Failed - This refill cannot be delegated      Failed - Urine Drug Screen completed in last 360 days.      Passed - Valid encounter within last 6 months    Recent Outpatient Visits           2 weeks ago Annual physical exam   [facility] Maple Hudson., MD   4 years ago Suprapubic pain, unspecified laterality   [facility] [place], Megan P, DO   4 years ago ETD (eustachian tube dysfunction), left   [facility] [place],  [place], DO   4 years ago Situational anxiety   [facility] [place], [place], DO

## 2020-04-20 ENCOUNTER — Other Ambulatory Visit: Payer: Self-pay | Admitting: Family Medicine

## 2020-04-20 DIAGNOSIS — F418 Other specified anxiety disorders: Secondary | ICD-10-CM

## 2020-04-20 MED ORDER — CLONAZEPAM 0.5 MG PO TABS: 0.5000 mg | ORAL_TABLET | Freq: Every day | ORAL | 0 refills | Status: DC | PRN

## 2020-04-20 NOTE — Telephone Encounter (Signed)
Medication Refill - Medication: clonazepam  Has the patient contacted their pharmacy? No. (Agent: If no, request that the patient contact the pharmacy for the refill.) (Agent: If yes, when and what did the pharmacy advise?)  Preferred Pharmacy (with phone number or street name):[id number] [place], [place] - [address]  Agent: Please be advised that RX refills may take up to 3 business days. We ask that you follow-up with your pharmacy.

## 2020-06-22 ENCOUNTER — Other Ambulatory Visit: Payer: Self-pay | Admitting: Family Medicine

## 2020-06-22 DIAGNOSIS — F418 Other specified anxiety disorders: Secondary | ICD-10-CM

## 2020-06-22 NOTE — Telephone Encounter (Signed)
Requested medication (s) are due for refill today: yes  Requested medication (s) are on the active medication list: yes  Last refill:  04/20/20  Future visit scheduled:no  Notes to clinic:  not delegated; no valid encounter within last 6 months    Requested Prescriptions  Pending Prescriptions Disp Refills   clonazePAM (KLONOPIN) 0.5 MG tablet 30 tablet 0    Sig: Take 1 tablet (0.5 mg total) by mouth daily as needed for anxiety.      Not Delegated - Psychiatry:  Anxiolytics/Hypnotics Failed - 06/22/2020 11:16 AM      Failed - This refill cannot be delegated      Failed - Urine Drug Screen completed in last 360 days      Failed - Valid encounter within last 6 months    Recent Outpatient Visits           6 months ago Annual physical exam   [facility] [place], Richard L Jr., MD   4 years ago Suprapubic pain, unspecified laterality   [facility] [place], Megan P, DO   4 years ago ETD (eustachian tube dysfunction), left   [facility] [place], Megan P, DO   5 years ago Situational anxiety   [facility] [place], Megan P, DO                 

## 2020-06-22 NOTE — Telephone Encounter (Signed)
Copied from CRM [id number]. Topic: Quick Communication - Rx Refill/Question >> Jun 22, 2020 10:55 AM Jaquita Rector A wrote: Medication: clonazePAM (KLONOPIN) 0.5 MG tablet   Has the patient contacted their pharmacy? Yes.   (Agent: If no, request that the patient contact the pharmacy for the refill.) (Agent: If yes, when and what did the pharmacy advise?)  Preferred Pharmacy (with phone number or street name): [id number] [place], [place] [address]  Phone:  [phone] Fax:  [phone]     Agent: Please be advised that RX refills may take up to 3 business days. We ask that you follow-up with your pharmacy.

## 2020-06-22 NOTE — Telephone Encounter (Signed)
Requested medication (s) are due for refill today: yes  Requested medication (s) are on the active medication list: yes  Last refill:  04/20/20  Future visit scheduled:no  Notes to clinic:  not delegated; no valid encounter within last 6 months    Requested Prescriptions  Pending Prescriptions Disp Refills   clonazePAM (KLONOPIN) 0.5 MG tablet 30 tablet 0    Sig: Take 1 tablet (0.5 mg total) by mouth daily as needed for anxiety.      Not Delegated - Psychiatry:  Anxiolytics/Hypnotics Failed - 06/22/2020 11:16 AM      Failed - This refill cannot be delegated      Failed - Urine Drug Screen completed in last 360 days      Failed - Valid encounter within last 6 months    Recent Outpatient Visits           6 months ago Annual physical exam   [facility] Maple Hudson., MD   4 years ago Suprapubic pain, unspecified laterality   [facility] [place], Megan P, DO   4 years ago ETD (eustachian tube dysfunction), left   [facility] [place], [place], DO   5 years ago Situational anxiety   [facility] [place], [place], DO

## 2020-06-28 ENCOUNTER — Other Ambulatory Visit: Payer: Self-pay | Admitting: Family Medicine

## 2020-06-28 DIAGNOSIS — F418 Other specified anxiety disorders: Secondary | ICD-10-CM

## 2020-06-28 MED ORDER — CLONAZEPAM 0.5 MG PO TABS: 0.5000 mg | ORAL_TABLET | Freq: Every day | ORAL | 0 refills | Status: DC | PRN

## 2020-06-28 NOTE — Telephone Encounter (Signed)
Requested medication (s) are due for refill today: no  Requested medication (s) are on the active medication list: yes  Last refill:  04/20/2020  Future visit scheduled: no  Notes to clinic:  this refill cannot be delegated   Requested Prescriptions  Pending Prescriptions Disp Refills   clonazePAM (KLONOPIN) 0.5 MG tablet [Pharmacy Med Name: CLONAZEPAM 0.5 MG TABLET] 30 tablet 0    Sig: TAKE 1 TABLET BY MOUTH EVERY DAY AS NEEDED FOR ANXIETY      Not Delegated - Psychiatry:  Anxiolytics/Hypnotics Failed - 06/28/2020  9:23 AM      Failed - This refill cannot be delegated      Failed - Urine Drug Screen completed in last 360 days      Failed - Valid encounter within last 6 months    Recent Outpatient Visits           6 months ago Annual physical exam   [facility] Maple Hudson., MD   4 years ago Suprapubic pain, unspecified laterality   [facility] [place], Megan P, DO   4 years ago ETD (eustachian tube dysfunction), left   [facility] [place], [place], DO   5 years ago Situational anxiety   [facility] [place], [place], DO

## 2020-06-28 NOTE — Telephone Encounter (Signed)
Pt is calling checking on the status of  Clonazepam refill

## 2020-06-28 NOTE — Telephone Encounter (Signed)
Pharmacy calling regarding this medication. Please advise.

## 2020-11-15 ENCOUNTER — Telehealth (INDEPENDENT_AMBULATORY_CARE_PROVIDER_SITE_OTHER): Payer: BC Managed Care – PPO | Admitting: Family Medicine

## 2020-11-15 ENCOUNTER — Ambulatory Visit: Payer: BLUE CROSS/BLUE SHIELD | Admitting: Adult Health

## 2020-11-15 ENCOUNTER — Encounter: Payer: Self-pay | Admitting: Family Medicine

## 2020-11-15 DIAGNOSIS — J069 Acute upper respiratory infection, unspecified: Secondary | ICD-10-CM

## 2020-11-15 DIAGNOSIS — F418 Other specified anxiety disorders: Secondary | ICD-10-CM | POA: Diagnosis not present

## 2020-11-15 MED ORDER — PREDNISONE 20 MG PO TABS: 20.0000 mg | ORAL_TABLET | Freq: Every day | ORAL | 0 refills | Status: AC

## 2020-11-15 MED ORDER — CLONAZEPAM 0.5 MG PO TABS: 0.5000 mg | ORAL_TABLET | Freq: Every day | ORAL | 0 refills | Status: DC | PRN

## 2020-11-15 NOTE — Patient Instructions (Signed)
Viral Respiratory Infection A viral respiratory infection is an illness that affects parts of the body that are used for breathing. These include the lungs, nose, and throat. It is caused by a germ called a virus. Some examples of this kind of infection are:  A cold.  The flu (influenza).  A respiratory syncytial virus (RSV) infection. A person who gets this illness may have the following symptoms:  A stuffy or runny nose.  Yellow or green fluid in the nose.  A cough.  Sneezing.  Tiredness (fatigue).  Achy muscles.  A sore throat.  Sweating or chills.  A fever.  A headache. Follow these instructions at home: Managing pain and congestion  Take over-the-counter and prescription medicines only as told by your doctor.  If you have a sore throat, gargle with salt water. Do this 3-4 times per day or as needed. To make a salt-water mixture, dissolve -1 tsp of salt in 1 cup of warm water. Make sure that all the salt dissolves.  Use nose drops made from salt water. This helps with stuffiness (congestion). It also helps soften the skin around your nose.  Drink enough fluid to keep your pee (urine) pale yellow. General instructions  Rest as much as possible.  Do not drink alcohol.  Do not use any products that have nicotine or tobacco, such as cigarettes and e-cigarettes. If you need help quitting, ask your doctor.  Keep all follow-up visits as told by your doctor. This is important.   How is this prevented?  Get a flu shot every year. Ask your doctor when you should get your flu shot.  Do not let other people get your germs. If you are sick: ? Stay home from work or school. ? Wash your hands with soap and water often. Wash your hands after you cough or sneeze. If soap and water are not available, use hand sanitizer.  Avoid contact with people who are sick during cold and flu season. This is in fall and winter.   Get help if:  Your symptoms last for 10 days or  longer.  Your symptoms get worse over time.  You have a fever.  You have very bad pain in your face or forehead.  Parts of your jaw or neck become very swollen. Get help right away if:  You feel pain or pressure in your chest.  You have shortness of breath.  You faint or feel like you will faint.  You keep throwing up (vomiting).  You feel confused. Summary  A viral respiratory infection is an illness that affects parts of the body that are used for breathing.  Examples of this illness include a cold, the flu, and respiratory syncytial virus (RSV) infection.  The infection can cause a runny nose, cough, sneezing, sore throat, and fever.  Follow what your doctor tells you about taking medicines, drinking lots of fluid, washing your hands, resting at home, and avoiding people who are sick. This information is not intended to replace advice given to you by your health care provider. Make sure you discuss any questions you have with your health care provider. Document Revised: 08/06/2018 Document Reviewed: 09/08/2017 Elsevier Patient Education  2021 [employer]  

## 2020-11-15 NOTE — Progress Notes (Signed)
Telephone Virtual Visit    Virtual Visit via Video Note   This visit type was conducted due to national recommendations for restrictions regarding the COVID-19 Pandemic (e.g. social distancing) in an effort to limit this patient's exposure and mitigate transmission in our community. This patient is at least at moderate risk for complications without adequate follow up. This format is felt to be most appropriate for this patient at this time. Physical exam was limited by quality of the video and audio technology used for the visit.   Patient location: home with no family members present Provider location: Home office  I discussed the limitations of evaluation and management by telemedicine and the availability of in person appointments. The patient expressed understanding and agreed to proceed.  Patient: Robert Castaneda   DOB: 07/30/1981   40 y.o. Male  MRN: [id number] Visit Date: 11/15/2020  Today's healthcare provider: Azalee Course Lavone Barrientes, FNP   Chief Complaint  Patient presents with  . URI  . Anxiety   Subjective    HPI HPI    URI    Associated symptoms inlclude congestion and cough.  Recent episode started 1 to 4 weeks ago.  The problem has been unchanged since onset.  The temperature has been with in normal range.  Patient  is drinking plenty of fluids.  Patient is not a smoker.       Last edited by Fonda Kinder, CMA on 11/15/2020  9:41 AM. (History)     Patient states that he has tried taking otc Mucinex, Day/Nyquil and Claritin with no symptom relief.  Seen by Urgent care 3/25 for Viral URI Since then continues to have chest congestion Started with a head cold Has intermittent ringing of the years Lasted for a few days with a minor cough That resolved, and moved to chest Does have allergies this time of year and asthma Takes claritin thru the day as well as dayquil and mucinex Feels like something is in his chest Denies wheezing or ratteling Does have congestion  in the morning and has to work to make productive Urgent care felt sounded clear and declined xray Seemed to be getting better at that time Still having minimally productive cough Concerned about how long symptoms have been lingering and the potential for infection   Anxiety, Follow-up  He was last seen for anxiety 11 months ago. Changes made at last visit include ordering labs, continue Clonazepam PRN.   He reports excellent compliance with treatment. He reports excellent tolerance of treatment. He is not having side effects.  He feels his anxiety is mild and Improved since last visit. Needs clonazepam refills Work has been extremely busy Does not use daily, use as needed   Symptoms: No chest pain No difficulty concentrating  No dizziness No fatigue  No feelings of losing control No insomnia  No irritable No palpitations  No panic attacks No racing thoughts  No shortness of breath No sweating  No tremors/shakes    GAD-7 Results No flowsheet data found.  PHQ-9 Scores PHQ9 SCORE ONLY 12/07/2019  PHQ-9 Total Score 0    --------------------------------------------------------------------------------------------------- Patient Active Problem List   Diagnosis Date Noted  . Situational anxiety 03/08/2015   Past Medical History:  Diagnosis Date  . Allergy   . Anxiety    Social   . Asthma    Allergies  Allergen Reactions  . Apple Swelling  . Carrot [Daucus Carota]     Pain       Medications: Outpatient  Medications Prior to Visit  Medication Sig  . [DISCONTINUED] clonazePAM (KLONOPIN) 0.5 MG tablet Take 1 tablet (0.5 mg total) by mouth daily as needed for anxiety.  . fluticasone (FLONASE) 50 MCG/ACT nasal spray Place 2 sprays into both nostrils daily. (Patient not taking: No sig reported)  . montelukast (SINGULAIR) 10 MG tablet Take 1 tablet (10 mg total) by mouth daily. (Patient not taking: Reported on 11/15/2020)   No facility-administered medications prior to  visit.    Review of Systems  Constitutional: Negative for chills, fatigue and fever.  HENT: Positive for congestion and postnasal drip. Negative for sinus pressure, sinus pain and sore throat.   Respiratory: Positive for cough. Negative for chest tightness, shortness of breath and wheezing.   Cardiovascular: Negative for chest pain.  Psychiatric/Behavioral: The patient is nervous/anxious.      Physical Exam   No vital signs were done and limited physical exam due to virtual visit.  Constitutional:      General: He is not in acute distress.  Pulmonary:     Effort: Pulmonary effort is normal. No respiratory distress.  Neurological:     Mental Status: He is alert and oriented to person, place, and time.  Psychiatric:        Mood and Affect: Mood normal.        Behavior: Behavior normal.    Assessment & Plan     Problem List Items Addressed This Visit      Other   Situational anxiety   Relevant Medications   clonazePAM (KLONOPIN) 0.5 MG tablet    Other Visit Diagnoses    Viral URI with cough    -  Primary   Relevant Medications   predniSONE (DELTASONE) 20 MG tablet   Other Relevant Orders   DG Chest 2 View     Plan CXR when able, will follow up with results Continue OTC conservative treatments Prednisone x 5 days for symptome relief Clonazepam refills sent, PDMP reviewed   Return in about 3 months (around 02/14/2021), or if symptoms worsen or fail to improve.     I discussed the assessment and treatment plan with the patient. The patient was provided an opportunity to ask questions and all were answered. The patient agreed with the plan and demonstrated an understanding of the instructions.   The patient was advised to call back or seek an in-person evaluation if the symptoms worsen or if the condition fails to improve as anticipated.  I provided 20 minutes of non-face-to-face time during this encounter.   Azalee Course Anthea Udovich, FNP [facility] [phone] (phone) [phone] (fax)  [facility]

## 2020-11-16 ENCOUNTER — Encounter: Payer: Self-pay | Admitting: Family Medicine

## 2020-11-16 ENCOUNTER — Other Ambulatory Visit: Payer: Self-pay

## 2020-11-16 ENCOUNTER — Ambulatory Visit
Admission: RE | Admit: 2020-11-16 | Discharge: 2020-11-16 | Disposition: A | Discharge status: Home / Self Care | Payer: BC Managed Care – PPO | Source: Ambulatory Visit | Attending: Family Medicine | Admitting: Family Medicine

## 2020-11-16 ENCOUNTER — Telehealth: Payer: Self-pay

## 2020-11-16 DIAGNOSIS — J069 Acute upper respiratory infection, unspecified: Secondary | ICD-10-CM | POA: Insufficient documentation

## 2020-11-16 NOTE — Telephone Encounter (Signed)
Pt called asking if someone could look at his xray and call him back.  He would like someone to call him back asap. CB#  [phone]

## 2020-11-16 NOTE — Telephone Encounter (Signed)
Spoke to patient about the x-ray.  Resulsts not in yet.  Will let patient know when  They are reported

## 2020-11-17 NOTE — Telephone Encounter (Signed)
Pt called to check on imaging results/ please advise

## 2020-11-17 NOTE — Telephone Encounter (Signed)
Pt was seen by Macario Carls Just, NP. Results are not results. Will call once this has been done.

## 2020-11-17 NOTE — Telephone Encounter (Signed)
Patient is calling again to get his x-ray results.  He stated that he was told that the results would be in this morning and he has not heard back yet.  Please call patient asap with results at [phone]

## 2020-11-20 NOTE — Telephone Encounter (Signed)
Pt has already been advised.

## 2020-11-20 NOTE — Telephone Encounter (Signed)
CXR normal.

## 2020-11-20 NOTE — Telephone Encounter (Signed)
Please advise xray  

## 2020-11-21 ENCOUNTER — Telehealth: Payer: Self-pay | Admitting: Family Medicine

## 2021-02-01 ENCOUNTER — Other Ambulatory Visit: Payer: Self-pay | Admitting: Family Medicine

## 2021-02-01 DIAGNOSIS — F418 Other specified anxiety disorders: Secondary | ICD-10-CM

## 2021-02-01 NOTE — Telephone Encounter (Signed)
Medication Refill - Medication: clonazePAM (KLONOPIN) 0.5 MG tablet Pt asked if this can be sent today before he leave to go out of town tomorrow / please advise   Has the patient contacted their pharmacy? No. (Agent: If no, request that the patient contact the pharmacy for the refill.) (Agent: If yes, when and what did the pharmacy advise?)  Preferred Pharmacy (with phone number or street name): [id number] [place], [place] - [address]  [address] [place], [place] [address]  Phone:  [phone]  Fax:  [phone]   Agent: Please be advised that RX refills may take up to 3 business days. We ask that you follow-up with your pharmacy.

## 2021-02-01 NOTE — Telephone Encounter (Signed)
Requested medication (s) are due for refill today:yes   Requested medication (s) are on the active medication list:  yes  Last refill:  11/15/2020  Future visit scheduled: no  Notes to clinic: This refill cannot be delegated Patient needs today    Requested Prescriptions  Pending Prescriptions Disp Refills   clonazePAM (KLONOPIN) 0.5 MG tablet 30 tablet 0    Sig: Take 1 tablet (0.5 mg total) by mouth daily as needed for anxiety.      Not Delegated - Psychiatry:  Anxiolytics/Hypnotics Failed - 02/01/2021 12:39 PM      Failed - This refill cannot be delegated      Failed - Urine Drug Screen completed in last 360 days      Passed - Valid encounter within last 6 months    Recent Outpatient Visits           2 months ago Viral URI with cough   [facility] Just, Azalee Course, FNP   1 year ago Annual physical exam   [facility] Hudson., MD   5 years ago Suprapubic pain, unspecified laterality   [facility] [place], Megan P, DO   5 years ago ETD (eustachian tube dysfunction), left   [facility] [place], [place], DO   5 years ago Situational anxiety   [facility] [place], [place], DO

## 2021-02-05 MED ORDER — CLONAZEPAM 0.5 MG PO TABS: 0.5000 mg | ORAL_TABLET | Freq: Every day | ORAL | 0 refills | Status: DC | PRN

## 2021-02-08 ENCOUNTER — Other Ambulatory Visit: Payer: Self-pay | Admitting: Family Medicine

## 2021-02-08 DIAGNOSIS — J302 Other seasonal allergic rhinitis: Secondary | ICD-10-CM

## 2021-04-19 ENCOUNTER — Other Ambulatory Visit: Payer: Self-pay | Admitting: Family Medicine

## 2021-04-19 DIAGNOSIS — F418 Other specified anxiety disorders: Secondary | ICD-10-CM

## 2021-04-19 NOTE — Telephone Encounter (Signed)
Requested medications are on the active medication list yes  Last visit 11/2020  Future visit scheduled no  Notes to clinic Not Delegated.

## 2021-04-19 NOTE — Telephone Encounter (Signed)
Copied from CRM [id number]. Topic: Quick Communication - Rx Refill/Question >> Apr 19, 2021  3:46 PM Gaetana Michaelis A wrote: Medication: clonazePAM (KLONOPIN) 0.5 MG tablet [[id number]]   Has the patient contacted their pharmacy? No. (Agent: If no, request that the patient contact the pharmacy for the refill.) (Agent: If yes, when and what did the pharmacy advise?)  Preferred Pharmacy (with phone number or street name): [id number] [place], [place] [address]  Phone:  [phone] Fax:  [phone]  Agent: Please be advised that RX refills may take up to 3 business days. We ask that you follow-up with your pharmacy.

## 2021-04-24 MED ORDER — CLONAZEPAM 0.5 MG PO TABS: 0.5000 mg | ORAL_TABLET | Freq: Every day | ORAL | 0 refills | Status: DC | PRN

## 2021-07-06 ENCOUNTER — Other Ambulatory Visit: Payer: Self-pay | Admitting: Family Medicine

## 2021-07-06 DIAGNOSIS — F418 Other specified anxiety disorders: Secondary | ICD-10-CM

## 2021-07-09 ENCOUNTER — Other Ambulatory Visit: Payer: Self-pay | Admitting: Family Medicine

## 2021-07-09 DIAGNOSIS — F418 Other specified anxiety disorders: Secondary | ICD-10-CM

## 2021-07-09 MED ORDER — CLONAZEPAM 0.5 MG PO TABS: 0.5000 mg | ORAL_TABLET | Freq: Two times a day (BID) | ORAL | 0 refills | Status: DC | PRN

## 2021-07-09 MED ORDER — CLONAZEPAM 0.5 MG PO TABS: 0.5000 mg | ORAL_TABLET | Freq: Two times a day (BID) | ORAL | 1 refills | Status: DC | PRN

## 2021-07-09 NOTE — Telephone Encounter (Signed)
LOV: 11/15/2020  NOV: None   Last Refill:  04/24/2021  #30 0 Refills.   Thanks,   -Vernona Rieger

## 2021-10-01 ENCOUNTER — Ambulatory Visit: Payer: Self-pay

## 2021-10-01 NOTE — Telephone Encounter (Signed)
Summary: pt states got a little burn on finger last week and swelling and a little warm   Pt has called in and states just got a little burn finger several days ago and thinks getting infected, some swelling and  little warm. Fu [phone]      Chief Complaint: Burn to right middle finger Symptoms: Swelling Frequency: Started last Thursday Pertinent Negatives: Patient denies pain Disposition: [] ED /[] Urgent Care (no appt availability in office) / [] Appointment(In office/virtual)/ []  [facility]/ [] Home Care/ [] Refused Recommended Disposition /[] [facility]/ []  Follow-up with PCP Additional Notes: Pt. Wanted morning appointment. No availability. Will go to UC.  Answer Assessment - Initial Assessment Questions 1. ONSET: "When did it happen?" If happened < 3 hours ago, ask: "Did you apply cold water?" If not, give First Aid Advice immediately.      Last Thursday 2. LOCATION: "Where is the burn located?"      Right middle finger 3. BURN SIZE: "How large is the burn?"  The palm is roughly 1% of the total body surface area (BSA).     3/4 inch 4. SEVERITY OF THE BURN: "Are there any blisters?"      Open 5. MECHANISM: "Tell me how it happened."     Hot oil 6. PAIN: "Are you having any pain?" "How bad is the pain?" (Scale 1-10; or mild, moderate, severe)   - MILD (1-3): doesn't interfere with normal activities    - MODERATE (4-7): interferes with normal activities or awakens from sleep    - SEVERE (8-10): excruciating pain, unable to do any normal activities      Mild 7. INHALATION INJURY: "Were you exposed to any smoke or fumes?" If Yes, ask: "Do you have any cough or difficulty breathing?"     No 8. OTHER SYMPTOMS: "Do you have any other symptoms?" (e.g., headache, nausea)     Swelling 9. PREGNANCY: "Is there any chance you are pregnant?" "When was your last menstrual period?"     N/a  Protocols used: [facility]

## 2022-03-21 ENCOUNTER — Other Ambulatory Visit: Payer: Self-pay | Admitting: Family Medicine

## 2022-03-21 NOTE — Telephone Encounter (Signed)
Medication Refill - Medication: clonazepam 0.5 mg  Has the patient contacted their pharmacy? No.  He said he has to call Dr. Sullivan Lone. (Agent: If no, request that the patient contact the pharmacy for the refill. If patient does not wish to contact the pharmacy document the reason why and proceed with request.) (Agent: If yes, when and what did the pharmacy advise?)  Preferred Pharmacy (with phone number or street name): [facility] [address] Has the patient been seen for an appointment in the last year OR does the patient have an upcoming appointment? Yes.    Agent: Please be advised that RX refills may take up to 3 business days. We ask that you follow-up with your pharmacy.

## 2022-03-22 NOTE — Telephone Encounter (Signed)
Requested medications are due for refill today.  yes  Requested medications are on the active medications list.  yes  Last refill. 07/09/2021 #20 1 refill   Future visit scheduled.   no  Notes to clinic.  Refill not delegated. Pt over due for OV.    Requested Prescriptions  Pending Prescriptions Disp Refills   clonazePAM (KLONOPIN) 0.5 MG tablet 20 tablet 1    Sig: Take 1 tablet (0.5 mg total) by mouth 2 (two) times daily as needed for anxiety. Pt will need appointment for further refills     Not Delegated - Psychiatry: Anxiolytics/Hypnotics 2 Failed - 03/21/2022  5:34 PM      Failed - This refill cannot be delegated      Failed - Urine Drug Screen completed in last 360 days      Failed - Valid encounter within last 6 months    Recent Outpatient Visits           1 year ago Viral URI with cough   [facility] Just, Azalee Course, FNP   2 years ago Annual physical exam   [facility] Maple Hudson., MD   6 years ago Suprapubic pain, unspecified laterality   [facility] [place], Megan P, DO   6 years ago ETD (eustachian tube dysfunction), left   [facility] [place], [place], DO   7 years ago Situational anxiety   [facility] [place], [place], DO              Passed - Patient is not pregnant

## 2022-03-22 NOTE — Telephone Encounter (Signed)
Called pt - [id number] to schedule appointment.

## 2022-03-25 ENCOUNTER — Telehealth (INDEPENDENT_AMBULATORY_CARE_PROVIDER_SITE_OTHER): Payer: Self-pay | Admitting: Family Medicine

## 2022-03-25 DIAGNOSIS — F418 Other specified anxiety disorders: Secondary | ICD-10-CM

## 2022-03-25 DIAGNOSIS — J302 Other seasonal allergic rhinitis: Secondary | ICD-10-CM

## 2022-03-25 MED ORDER — CLONAZEPAM 0.5 MG PO TABS: 0.5000 mg | ORAL_TABLET | Freq: Two times a day (BID) | ORAL | 1 refills | Status: DC | PRN

## 2022-03-25 MED ORDER — MONTELUKAST SODIUM 10 MG PO TABS: 10.0000 mg | ORAL_TABLET | Freq: Every day | ORAL | 0 refills | Status: DC

## 2022-03-25 MED ORDER — FLUTICASONE PROPIONATE 50 MCG/ACT NA SUSP: 2.0000 | Freq: Every day | NASAL | 6 refills | Status: DC

## 2022-03-25 NOTE — Progress Notes (Signed)
MyChart Video Visit    Virtual Visit via Video Note   This visit type was conducted due to national recommendations for restrictions regarding the COVID-19 Pandemic (e.g. social distancing) in an effort to limit this patient's exposure and mitigate transmission in our community. This patient is at least at moderate risk for complications without adequate follow up. This format is felt to be most appropriate for this patient at this time. Physical exam was limited by quality of the video and audio technology used for the visit.   Patient location: Home Provider location: Office  I discussed the limitations of evaluation and management by telemedicine and the availability of in person appointments. The patient expressed understanding and agreed to proceed.  Patient: Robert Castaneda   DOB: [birth year]   41 y.o. Male  MRN: [id number] Visit Date: 03/25/2022  Today's healthcare provider: Megan Mans, MD   No chief complaint on file.  Subjective    HPI  Patient wants to discuss clonazepam and get refills.  I have not seen him in 2-1/2 years but he is feeling well overall.  He did not get his labs done at his physical 2 and half years ago.  He is agreeable to coming in early next year for physical.  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 Pt will need appointment for further refills   fluticasone (FLONASE) 50 MCG/ACT nasal spray Place 2 sprays into both nostrils daily. (Patient not taking: No sig reported)   montelukast (SINGULAIR) 10 MG tablet TAKE 1 TABLET BY MOUTH EVERY DAY   No facility-administered medications prior to visit.    Review of Systems      Objective    There were no vitals taken for this visit.  BP Readings from Last  3 Encounters:  12/07/19 134/80  11/21/17 133/70  09/19/15 (!) 143/89   Wt Readings from Last 3 Encounters:  12/07/19 221 lb (100.2 kg)  11/20/17 225 lb (102.1 kg)  09/19/15 234 lb (106.1 kg)       Physical Exam     Assessment & Plan     1. Seasonal allergies  - montelukast (SINGULAIR) 10 MG tablet; Take 1 tablet (10 mg total) by mouth daily.  Dispense: 90 tablet; Refill: 0 - fluticasone (FLONASE) 50 MCG/ACT nasal spray; Place 2 sprays into both nostrils daily.  Dispense: 16 g; Refill: 6  2. Situational anxiety Will need an appointment for any further refills.  He is given 30 with 1 refill.  Like to see him for a physical early next year. - clonazePAM (KLONOPIN) 0.5 MG tablet; Take 1 tablet (0.5 mg total) by mouth 2 (two) times daily as needed for anxiety.  Dispense: 30 tablet; Refill: 1   No follow-ups on file.     I discussed the assessment and treatment plan with the patient. The patient was provided an opportunity to ask questions and all were answered. The patient agreed with the plan and demonstrated an understanding of the instructions.   The patient was advised to call back or seek an in-person evaluation if the symptoms worsen or if the condition fails to improve as anticipated.  I provided 12 minutes of non-face-to-face time during this encounter.  I, Megan Mans, MD, have  reviewed all documentation for this visit. The documentation on 03/25/22 for the exam, diagnosis, procedures, and orders are all accurate and complete.   Trenise Turay Wendelyn Breslow, MD [facility] [phone] (phone) [phone] (fax)  [facility]

## 2022-07-24 ENCOUNTER — Ambulatory Visit (INDEPENDENT_AMBULATORY_CARE_PROVIDER_SITE_OTHER): Payer: BC Managed Care – PPO | Admitting: Family Medicine

## 2022-07-24 ENCOUNTER — Ambulatory Visit: Payer: Self-pay

## 2022-07-24 ENCOUNTER — Encounter: Payer: Self-pay | Admitting: Family Medicine

## 2022-07-24 VITALS — BP 132/88 | HR 105 | Temp 98.7°F | Resp 16 | Ht 72.0 in | Wt 238.8 lb

## 2022-07-24 DIAGNOSIS — R1084 Generalized abdominal pain: Secondary | ICD-10-CM

## 2022-07-24 DIAGNOSIS — R1033 Periumbilical pain: Secondary | ICD-10-CM | POA: Insufficient documentation

## 2022-07-24 NOTE — Progress Notes (Signed)
I,Robert Castaneda,acting as a scribe for [employer], MD.,have documented all relevant documentation on the behalf of Robert Ramp, MD,as directed by  Robert Ramp, MD while in the presence of Robert Ramp, MD.   Established patient visit   Patient: Robert Castaneda   DOB: [birth year]   41 y.o. Male  MRN: [id number] Visit Date: 07/24/2022  Today's healthcare provider: [name], MD   Chief Complaint  Patient presents with   Abdominal Pain   Subjective    Abdominal Pain This is a new problem. The current episode started in the past 7 days (x4 days). The onset quality is sudden. The problem occurs intermittently. The problem has been unchanged. The pain is located in the suprapubic region. The pain is at a severity of 6/10. The pain is moderate. The quality of the pain is dull and cramping. The abdominal pain does not radiate. Associated symptoms include constipation ("small stools"). Pertinent negatives include no fever or vomiting. The pain is aggravated by movement. The pain is relieved by Bowel movements. Treatments tried: Dulcolax. The treatment provided mild relief.    Patient states that symptoms started 4AM on Sunday morning that woke him from sleep and felt like cramping and after a bowel movement, the pain is lessened  Localizes pain to about the size of his hand below his navel  He denies bleeding in the stool or black stool  Pictures reviewed  He has had three stools so far this am  He has been taking the dulcolax and stools have gotten softer and volume has increased over the course of the illness  He denies feeling bloating  He reports feeling well now in the office  He reports having a new apple pie over the weekend before this started  He reports having reflux symptoms after eating raw apples or carrots    He reports having explosive diarrhea for one week after thanksgiving brine preparation  He was  the only family member who felt sick after the [place] meal  He denies hx of digestive issues  He reports that he has noticed that he has more sensitivity with age  He denies urinary symptoms and has no hematuria that I can visualize on images.  He showed of his bowel movements   Medications: Outpatient Medications Prior to Visit  Medication Sig   clonazePAM (KLONOPIN) 0.5 MG tablet Take 1 tablet (0.5 mg total) by mouth 2 (two) times daily as needed for anxiety.   fluticasone (FLONASE) 50 MCG/ACT nasal spray Place 2 sprays into both nostrils daily.   montelukast (SINGULAIR) 10 MG tablet Take 1 tablet (10 mg total) by mouth daily.   No facility-administered medications prior to visit.    Review of Systems  Constitutional:  Negative for fever.  Gastrointestinal:  Positive for abdominal pain and constipation ("small stools"). Negative for vomiting.       Objective    BP 132/88 (BP Location: Left Arm, Patient Position: Sitting, Cuff Size: Large)   Pulse (!) 105   Temp 98.7 F (37.1 C) (Oral)   Resp 16   Ht 6' (1.829 m)   Wt 238 lb 12.8 oz (108.3 kg)   BMI 32.39 kg/m    Physical Exam Constitutional:      Appearance: He is well-developed. He is not ill-appearing or diaphoretic.  Eyes:     General: No scleral icterus. Cardiovascular:     Rate and Rhythm: Normal rate and regular rhythm.  Pulmonary:     Effort: Pulmonary  effort is normal.  Abdominal:     General: Bowel sounds are normal. There is no distension.     Palpations: Abdomen is soft. There is no fluid wave, hepatomegaly or mass.     Tenderness: There is abdominal tenderness in the suprapubic area. There is no right CVA tenderness, left CVA tenderness, guarding or rebound. Negative signs include Murphy's sign, Rovsing's sign and McBurney's sign.  Skin:    Capillary Refill: Capillary refill takes less than 2 seconds.     Coloration: Skin is not jaundiced, mottled or pale.     Findings: No erythema or rash.   Neurological:     Mental Status: He is alert and oriented to person, place, and time.  Psychiatric:        Mood and Affect: Mood normal.       No results found for any visits on 07/24/22.  Assessment & Plan     Problem List Items Addressed This Visit       Other   Periumbilical abdominal pain - Primary    Suspect that patient gastrointestinal viral illness following things given and is now recovering from this Recommended over-the-counter medications to monitor his symptoms He has no red flag findings on exam such as guarding, distention, hepatomegaly, Murphy sign or Rovsing Or McBurney's point tenderness Recommended starting MiraLAX and then adding Dulcolax or senna for mild constipation Also recommended starting probiotic Discussed return precautions, see AVS Patient to return as needed        Return if symptoms worsen or fail to improve.      I, Robert Ramp, MD, have reviewed all documentation for this visit.  Portions of this information were initially documented by the CMA and reviewed by me for thoroughness and accuracy.      Robert Ramp, MD  [facility] [phone] (phone) [phone] (fax)  [facility]

## 2022-07-24 NOTE — Patient Instructions (Addendum)
I believe your symptoms are related to healing from her recent likely viral GI bug.  As we discussed today, please continue to increase fluid intake.  If you begin to have constipation, recommend starting with MiraLAX that can be taken 1-3 times daily.  If the MiraLAX is unsuccessful and allow you to have 1-2 soft and easy to pass bowel movements per day, I recommend adding the Dulcolax or senna to help increase bowel movements.  If you are having no bowel movements after 3 days or you are having 6 or more watery bowel movements per day, I recommend returning to care.  We also discussed starting a probiotic as your gut flora may need to be replenished after recovering from your illness over the Thanksgiving holiday     Probiotics Probiotics are the good bacteria and yeasts that live in your body and keep your digestive system healthy. Probiotics also help your body's defense system (immune system) and protect your body against the growth of harmful bacteria. Your health care provider may recommend taking a probiotic if you are taking antibiotic medicine or have certain medical conditions, such as: Diarrhea. Constipation. Irritable bowel syndrome. Lung infections. Yeast infections. Acne, eczema, and other skin conditions. Frequent urinary tract infections. What affects the balance of bacteria in my body? The balance of good bacteria in your body can be affected by: Antibiotics. These medicines treat infections caused by bacteria. Unfortunately, they may kill the good bacteria in your body as well as the bad bacteria. Certain medical conditions. Conditions related to an imbalance of bacteria include: Stomach and intestine (gastrointestinal) infections. Lung infections. Skin infections. Vaginal infections. Inflammatory bowel diseases. Stomach ulcers (gastric ulcers). Tooth decay and gum disease (periodontal disease). Stress. A low-fiber diet. What type of probiotic is right for  me? Probiotics contain different types of bacteria (strains). Strains commonly found in probiotics include: Lactobacillus. Saccharomyces. Bifidobacterium. Specific strains have been shown to be more effective for certain health conditions. Ask your health care provider which strain or strains you should use and how often. Probiotics come in many different forms, strain combinations, and strengths. Some may need to be refrigerated. Always read the label for storage and usage instructions. Certain foods, such as yogurt, contain probiotics. Probiotics can also be bought as a supplement at a pharmacy, health food store, or grocery store. Talk to your health care provider before starting any supplement. What are the side effects of probiotics? Some people have side effects when taking probiotics. Side effects are usually temporary and may include: Gas. Bloating. Cramping. Serious side effects are rare. Follow these instructions at home:  Eat foods high in fiber, such as whole grains, beans, and vegetables. These foods can help good bacteria grow. Avoid certain foods as told by your health care provider. If you are taking probiotics with antibiotic medicine, take your probiotics as told by your health care provider. You may have to take probiotics for several weeks. This is to help the growth of good bacteria in your gut. Where to find more information International Scientific Association for Probiotics and Prebiotics: isappscience.org Summary Probiotics are the good bacteria and yeasts that live in your body and keep you and your digestive system healthy. Certain foods, such as yogurt, contain probiotics. Probiotics can be taken as supplements. They can be bought at a pharmacy, health food store, or grocery store. They come in many different forms, strain combinations, and strengths. Be sure to talk with your health care provider before taking a probiotic supplement. This  information is not  intended to replace advice given to you by your health care provider. Make sure you discuss any questions you have with your health care provider. Document Revised: 05/16/2021 Document Reviewed: 05/16/2021 Elsevier Patient Education  2023 [employer].

## 2022-07-24 NOTE — Assessment & Plan Note (Signed)
Suspect that patient gastrointestinal viral illness following things given and is now recovering from this Recommended over-the-counter medications to monitor his symptoms He has no red flag findings on exam such as guarding, distention, hepatomegaly, Murphy sign or Rovsing Or McBurney's point tenderness Recommended starting MiraLAX and then adding Dulcolax or senna for mild constipation Also recommended starting probiotic Discussed return precautions, see AVS Patient to return as needed

## 2022-07-24 NOTE — Telephone Encounter (Signed)
  Chief Complaint: lower abd pain Symptoms: dull pain that comes and goes 6/10, 2 episodes diarrhea Frequency: Sunday Pertinent Negatives: Patient denies fever, distension, urination pain and vomiting Disposition: [] ED /[] Urgent Care (no appt availability in office) / [x] Appointment(In office/virtual)/ []  [facility]/ [] Home Care/ [] Refused Recommended Disposition /[] [place]/ []  Follow-up with PCP Additional Notes: pt took laxative and had BM plus 2 episodes diarrhea Reason for Disposition  [1] MODERATE pain (e.g., interferes with normal activities) AND [2] pain comes and goes (cramps) AND [3] present > 24 hours  (Exception: Pain with Vomiting or Diarrhea - see that Guideline.)  Answer Assessment - Initial Assessment Questions 1. LOCATION: "Where does it hurt?"      4 inches below navel 2. RADIATION: "Does the pain shoot anywhere else?" (e.g., chest, back)     no 3. ONSET: "When did the pain begin?" (Minutes, hours or days ago)      Sunday am  4. SUDDEN: "Gradual or sudden onset?"     Sudden woke pt up  5. PATTERN "Does the pain come and go, or is it constant?"    - If it comes and goes: "How long does it last?" "Do you have pain now?"     (Note: Comes and goes means the pain is intermittent. It goes away completely between bouts.)    - If constant: "Is it getting better, staying the same, or getting worse?"      (Note: Constant means the pain never goes away completely; most serious pain is constant and gets worse.)      Comes and goes- lasts for until goes to BR then ok for an hour 6. SEVERITY: "How bad is the pain?"  (e.g., Scale 1-10; mild, moderate, or severe)    - MILD (1-3): Doesn't interfere with normal activities, abdomen soft and not tender to touch.     - MODERATE (4-7): Interferes with normal activities or awakens from sleep, abdomen tender to touch.     - SEVERE (8-10): Excruciating pain, doubled over, unable to do any normal activities.       Dull  - no pain      episodes pain is 6 7. RECURRENT SYMPTOM: "Have you ever had this type of stomach pain before?" If Yes, ask: "When was the last time?" and "What happened that time?"      2 years ago 1 week gnawing pain in area 8. CAUSE: "What do you think is causing the stomach pain?"     constipation 9. RELIEVING/AGGRAVATING FACTORS: "What makes it better or worse?" (e.g., antacids, bending or twisting motion, bowel movement)     BM 10. OTHER SYMPTOMS: "Do you have any other symptoms?" (e.g., back pain, diarrhea, fever, urination pain, vomiting)       Thinner BM 2 episodes diarrhea-  Protocols used: Abdominal Pain - Male-A-AH

## 2022-07-25 ENCOUNTER — Ambulatory Visit: Payer: Self-pay | Admitting: *Deleted

## 2022-07-25 NOTE — Telephone Encounter (Signed)
  Chief Complaint: low abdominal pain continues, told to call back if no better from OV 07/24/22 Symptoms: low abdominal pain , comes and goes. Severe at times mild now. Woke up from sleep last night "soaked". Denies fever, has been drinking more water throughout day. Had small soft BM today .  Frequency: last night  Pertinent Negatives: Patient denies fever, no N/V. No diarrhea no constipation. No pain radiating to other areas. Disposition: [x] ED /[] Urgent care (no appt availability in office) / [] Appointment(In office/virtual)/ []  [facility]/ [] Home Care/ [] Refused Recommended Disposition /[] [place] [facility]/ []  Follow-up with PCP Additional Notes:   Recommended if pain becomes severe again go to ED. Please advise if appt needed. Recommended to continue water intake, drink warm liquids as tolerated. Change diet to soft bland.     Reason for Disposition  [1] MODERATE pain (e.g., interferes with normal activities) AND [2] pain comes and goes (cramps) AND [3] present > 24 hours  (Exception: Pain with Vomiting or Diarrhea - see that Guideline.)  Answer Assessment - Initial Assessment Questions 1. LOCATION: "Where does it hurt?"      Low abdominal area  2. RADIATION: "Does the pain shoot anywhere else?" (e.g., chest, back)     No  3. ONSET: "When did the pain begin?" (Minutes, hours or days ago)      On going beginning of week  4. SUDDEN: "Gradual or sudden onset?"     Na  5. PATTERN "Does the pain come and go, or is it constant?"    - If it comes and goes: "How long does it last?" "Do you have pain now?"     (Note: Comes and goes means the pain is intermittent. It goes away completely between bouts.)    - If constant: "Is it getting better, staying the same, or getting worse?"      (Note: Constant means the pain never goes away completely; most serious pain is constant and gets worse.)      Comes and goes  6. SEVERITY: "How bad is the pain?"  (e.g., Scale 1-10; mild,  moderate, or severe)    - MILD (1-3): Doesn't interfere with normal activities, abdomen soft and not tender to touch.     - MODERATE (4-7): Interferes with normal activities or awakens from sleep, abdomen tender to touch.     - SEVERE (8-10): Excruciating pain, doubled over, unable to do any normal activities.       Mild now  but can become severe at times. Did wake up at night "soaked".  7. RECURRENT SYMPTOM: "Have you ever had this type of stomach pain before?" If Yes, ask: "When was the last time?" and "What happened that time?"      Yes last seen OV 07/24/22 8. CAUSE: "What do you think is causing the stomach pain?"     Not sure seen in OV 07/23/22 possible GI bug 9. RELIEVING/AGGRAVATING FACTORS: "What makes it better or worse?" (e.g., antacids, bending or twisting motion, bowel movement)     Not sure . Has tried pepto bismol 10. OTHER SYMPTOMS: "Do you have any other symptoms?" (e.g., back pain, diarrhea, fever, urination pain, vomiting)       Low abdominal pain, no urinary issues. BM small soft .  Protocols used: Abdominal Pain - Male-A-AH

## 2022-07-26 ENCOUNTER — Encounter: Payer: Self-pay | Admitting: Family Medicine

## 2022-07-26 ENCOUNTER — Ambulatory Visit
Admission: RE | Admit: 2022-07-26 | Discharge: 2022-07-26 | Disposition: A | Discharge status: Home / Self Care | Payer: BC Managed Care – PPO | Source: Home / Self Care | Attending: Family Medicine | Admitting: Family Medicine

## 2022-07-26 ENCOUNTER — Ambulatory Visit (INDEPENDENT_AMBULATORY_CARE_PROVIDER_SITE_OTHER): Payer: BC Managed Care – PPO | Admitting: Family Medicine

## 2022-07-26 ENCOUNTER — Ambulatory Visit: Payer: BC Managed Care – PPO

## 2022-07-26 ENCOUNTER — Ambulatory Visit
Admission: RE | Admit: 2022-07-26 | Discharge: 2022-07-26 | Disposition: A | Discharge status: Home / Self Care | Payer: BC Managed Care – PPO | Source: Ambulatory Visit | Attending: Family Medicine | Admitting: Family Medicine

## 2022-07-26 ENCOUNTER — Other Ambulatory Visit: Payer: Self-pay | Admitting: Family Medicine

## 2022-07-26 ENCOUNTER — Other Ambulatory Visit (HOSPITAL_COMMUNITY)
Admission: RE | Admit: 2022-07-26 | Discharge: 2022-07-26 | Disposition: A | Discharge status: Home / Self Care | Payer: BC Managed Care – PPO | Source: Ambulatory Visit | Attending: Family Medicine | Admitting: Family Medicine

## 2022-07-26 VITALS — BP 142/85 | HR 118 | Temp 98.0°F | Resp 16 | Wt 242.0 lb

## 2022-07-26 DIAGNOSIS — R1084 Generalized abdominal pain: Secondary | ICD-10-CM | POA: Insufficient documentation

## 2022-07-26 DIAGNOSIS — R102 Pelvic and perineal pain: Secondary | ICD-10-CM | POA: Insufficient documentation

## 2022-07-26 DIAGNOSIS — K2100 Gastro-esophageal reflux disease with esophagitis, without bleeding: Secondary | ICD-10-CM

## 2022-07-26 DIAGNOSIS — E669 Obesity, unspecified: Secondary | ICD-10-CM | POA: Diagnosis not present

## 2022-07-26 DIAGNOSIS — Z114 Encounter for screening for human immunodeficiency virus [HIV]: Secondary | ICD-10-CM | POA: Insufficient documentation

## 2022-07-26 DIAGNOSIS — Z125 Encounter for screening for malignant neoplasm of prostate: Secondary | ICD-10-CM

## 2022-07-26 DIAGNOSIS — Z131 Encounter for screening for diabetes mellitus: Secondary | ICD-10-CM

## 2022-07-26 DIAGNOSIS — Z1329 Encounter for screening for other suspected endocrine disorder: Secondary | ICD-10-CM | POA: Insufficient documentation

## 2022-07-26 DIAGNOSIS — K5720 Diverticulitis of large intestine with perforation and abscess without bleeding: Secondary | ICD-10-CM | POA: Diagnosis not present

## 2022-07-26 DIAGNOSIS — R030 Elevated blood-pressure reading, without diagnosis of hypertension: Secondary | ICD-10-CM

## 2022-07-26 DIAGNOSIS — E66811 Obesity, class 1: Secondary | ICD-10-CM

## 2022-07-26 DIAGNOSIS — Z789 Other specified health status: Secondary | ICD-10-CM

## 2022-07-26 DIAGNOSIS — Z1159 Encounter for screening for other viral diseases: Secondary | ICD-10-CM

## 2022-07-26 LAB — POCT URINALYSIS DIPSTICK
Bilirubin, UA: NEGATIVE
Blood, UA: NEGATIVE
Glucose, UA: NEGATIVE
Ketones, UA: POSITIVE
Leukocytes, UA: NEGATIVE
Nitrite, UA: NEGATIVE
Protein, UA: POSITIVE — AB
Spec Grav, UA: 1.02 (ref 1.010–1.025)
Urobilinogen, UA: 0.2 E.U./dL
pH, UA: 6 (ref 5.0–8.0)

## 2022-07-26 MED ORDER — LINACLOTIDE 145 MCG PO CAPS: 145.0000 ug | ORAL_CAPSULE | Freq: Every day | ORAL | 0 refills | Status: DC

## 2022-07-26 NOTE — Progress Notes (Signed)
I,Robert Castaneda,acting as a Neurosurgeon for Robert Kindle, Robert Castaneda.,have documented all relevant documentation on the behalf of Robert Kindle, Robert Castaneda,as directed by  Robert Kindle, Robert Castaneda while in the presence of Robert Kindle, Robert Castaneda.  Established patient visit  Patient: Robert Castaneda   DOB: 24-Feb-1981   41 y.o. Male  MRN: [id number] Visit Date: 07/26/2022  Today's healthcare provider: Jacky Kindle, Robert Castaneda  Introduced to nurse practitioner role and practice setting.  All questions answered.  Discussed provider/patient relationship and expectations.   Chief Complaint  Patient presents with   Abdominal Pain   Subjective    HPI  Follow up for abdominal pain  The patient was last seen for this 2 days ago. Changes made at last visit include Recommended over-the-counter medications to monitor his symptoms.  He reports fair compliance with treatment. He feels that condition is Unchanged. low abdominal pain , comes and goes. Severe at times mild now. Woke up from sleep two nights ago "soaked" in sweat with some chills. Denies fever, has been drinking more water throughout day. Had small soft BM yesterday. He is not having side effects.  -----------------------------------------------------------------------------------------   Medications: Outpatient Medications Prior to Visit  Medication Sig   [DISCONTINUED] clonazePAM (KLONOPIN) 0.5 MG tablet Take 1 tablet (0.5 mg total) by mouth 2 (two) times daily as needed for anxiety.   [DISCONTINUED] fluticasone (FLONASE) 50 MCG/ACT nasal spray Place 2 sprays into both nostrils daily.   [DISCONTINUED] montelukast (SINGULAIR) 10 MG tablet Take 1 tablet (10 mg total) by mouth daily.   [DISCONTINUED] omeprazole (PRILOSEC) 40 MG capsule Take 40 mg by mouth daily.   No facility-administered medications prior to visit.    Review of Systems  Constitutional:  Negative for appetite change, chills and fever.  Gastrointestinal:  Positive for abdominal pain and  constipation. Negative for abdominal distention, blood in stool, diarrhea, nausea and vomiting.  Genitourinary:  Negative for dysuria and hematuria.   Last BM 07/25/2022    Objective    BP (!) 142/85 (BP Location: Left Arm, Patient Position: Sitting, Cuff Size: Large)   Pulse (!) 118   Temp 98 F (36.7 C) (Oral)   Resp 16   Wt 242 lb (109.8 kg)   BMI 32.82 kg/m   BP Readings from Last 3 Encounters:  07/26/22 (!) 142/85  07/24/22 132/88  12/07/19 134/80   Wt Readings from Last 3 Encounters:  07/26/22 242 lb (109.8 kg)  07/24/22 238 lb 12.8 oz (108.3 kg)  12/07/19 221 lb (100.2 kg)   Physical Exam Vitals and nursing note reviewed.  Constitutional:      General: He is not in acute distress.    Appearance: Normal appearance. He is obese. He is not ill-appearing, toxic-appearing or diaphoretic.  HENT:     Head: Normocephalic and atraumatic.  Cardiovascular:     Rate and Rhythm: Regular rhythm. Tachycardia present.     Pulses: Normal pulses.     Heart sounds: Normal heart sounds.     Comments: Pt endorses agitation and irritation over CC Pulmonary:     Effort: Pulmonary effort is normal.     Breath sounds: Normal breath sounds.  Abdominal:     General: Bowel sounds are increased.     Palpations: Abdomen is soft. There is no shifting dullness, fluid wave, hepatomegaly, splenomegaly, mass or pulsatile mass.     Tenderness: There is abdominal tenderness in the suprapubic area. There is no right CVA tenderness, left CVA tenderness, guarding or rebound.  Negative signs include Murphy's sign and Rovsing's sign.  Genitourinary:    Comments: Exam deferred; denies concerns Musculoskeletal:        General: Normal range of motion.     Cervical back: Normal range of motion.  Skin:    General: Skin is warm and dry.     Capillary Refill: Capillary refill takes less than 2 seconds.  Neurological:     General: No focal deficit present.     Mental Status: He is alert and oriented to  person, place, and time. Mental status is at baseline.     Results for orders placed or performed in visit on 07/26/22  POCT urinalysis dipstick  Result Value Ref Range   Color, UA orange    Clarity, UA dark    Glucose, UA Negative Negative   Bilirubin, UA Negative    Ketones, UA Positive    Spec Grav, UA 1.020 1.010 - 1.025   Blood, UA Negative    pH, UA 6.0 5.0 - 8.0   Protein, UA Positive (A) Negative   Urobilinogen, UA 0.2 0.2 or 1.0 E.U./dL   Nitrite, UA Negative    Leukocytes, UA Negative Negative    Assessment & Plan     Problem List Items Addressed This Visit       Digestive   Gastroesophageal reflux disease with esophagitis without hemorrhage    Chronic, intermittent No longer using prilosec to assist Aware of dietary triggers ex pizza and uses OTC to assist if needed      Relevant Orders   CBC     Other   Elevated blood pressure reading in office without diagnosis of hypertension    Goal <140/<90 without known risk factors Pt reports "agitation and irritation" over the CC Continue to monitor       Relevant Orders   CBC   Comprehensive Metabolic Panel (CMET)   Encounter for hepatitis C screening test for low risk patient   Relevant Orders   Hepatitis C Antibody   Encounter for screening for HIV   Relevant Orders   HIV antibody (with reflex)   Generalized abdominal pain - Primary    Reports LLQ near suprapubic area; relief obtained if he passes flatus or has a BM Denies concern for bloating/distention BQ heard throughout Has tried dulcolax gummies; now using MiraLAX 17g/day and sample provided of linzess capsules  Will obtain abd [place]      Relevant Orders   CBC   Comprehensive Metabolic Panel (CMET)   US Abdomen Complete   Lipid panel   POCT urinalysis dipstick (Completed)   Urine Culture   Urine cytology ancillary only   Obesity (BMI 30.0-34.9)    Chronic, stable Body mass index is 32.82 kg/m. Discussed importance of healthy weight  management Discussed diet and exercise       Relevant Orders   CBC   Comprehensive Metabolic Panel (CMET)   Lipid panel   Hemoglobin A1c   Patient on ketogenic diet    UA positve for ketones; pt reports "on/off" a keto diet for the past 3 months Does not check ketones at home Continue to recommend balanced, lower carb meals. Smaller meal size, adding snacks. Choosing water as drink of choice and increasing purposeful exercise.       Screening for diabetes mellitus    Pt endorses difficulty losing weight despite lower carb diet Continue to recommend balanced, lower carb meals. Smaller meal size, adding snacks. Choosing water as drink of choice and increasing purposeful exercise.  Relevant Orders   Hemoglobin A1c   Screening for thyroid disorder   Relevant Orders   TSH   Screening PSA (prostate specific antigen)    Recommend initial screening with age Denies LUTS; recommend PSA in place of DRE. If PSA is elevated for age, we will repeat; if PSA remains elevated pt will be referred to urology for DRE and next steps for best treatment.       Suprapubic pain, acute    Pt denies BPH concerns; however, endorses pain in this region Reports he has no concern for STIs as he has been married for 13 years and "can only afford one wife" Reports LLQ near suprapubic area; relief obtained if he passes flatus or has a BM Denies concern for bloating/distention BQ heard throughout Has tried dulcolax gummies; now using MiraLAX 17g/day and sample provided of linzess capsules  Will obtain abd [place] UA positive for protein and ketones; will send for culture Continue to recommend STI screening given localized complaints       Return if symptoms worsen or fail to improve.     Robert Merl, Robert Castaneda, have reviewed all documentation for this visit. The documentation on 07/26/22 for the exam, diagnosis, procedures, and orders are all accurate and complete.  Robert Kindle, Robert Castaneda  [facility] [phone] (phone) [phone] (fax)  [facility]

## 2022-07-26 NOTE — Assessment & Plan Note (Signed)
Reports LLQ near suprapubic area; relief obtained if he passes flatus or has a BM Denies concern for bloating/distention BQ heard throughout Has tried dulcolax gummies; now using MiraLAX 17g/day and sample provided of linzess capsules  Will obtain abd [place]

## 2022-07-26 NOTE — Assessment & Plan Note (Signed)
Pt denies BPH concerns; however, endorses pain in this region Reports he has no concern for STIs as he has been married for 13 years and "can only afford one wife" Reports LLQ near suprapubic area; relief obtained if he passes flatus or has a BM Denies concern for bloating/distention BQ heard throughout Has tried dulcolax gummies; now using MiraLAX 17g/day and sample provided of linzess capsules  Will obtain abd [place] UA positive for protein and ketones; will send for culture Continue to recommend STI screening given localized complaints

## 2022-07-26 NOTE — Assessment & Plan Note (Signed)
Goal <140/<90 without known risk factors Pt reports "agitation and irritation" over the CC Continue to monitor

## 2022-07-26 NOTE — Assessment & Plan Note (Signed)
Chronic, intermittent No longer using prilosec to assist Aware of dietary triggers ex pizza and uses OTC to assist if needed

## 2022-07-26 NOTE — Assessment & Plan Note (Signed)
UA positve for ketones; pt reports "on/off" a keto diet for the past 3 months Does not check ketones at home Continue to recommend balanced, lower carb meals. Smaller meal size, adding snacks. Choosing water as drink of choice and increasing purposeful exercise.

## 2022-07-26 NOTE — Assessment & Plan Note (Signed)
Recommend initial screening with age Denies LUTS; recommend PSA in place of DRE. If PSA is elevated for age, we will repeat; if PSA remains elevated pt will be referred to urology for DRE and next steps for best treatment.

## 2022-07-26 NOTE — Assessment & Plan Note (Signed)
Chronic, stable Body mass index is 32.82 kg/m. Discussed importance of healthy weight management Discussed diet and exercise

## 2022-07-26 NOTE — Assessment & Plan Note (Signed)
Pt endorses difficulty losing weight despite lower carb diet Continue to recommend balanced, lower carb meals. Smaller meal size, adding snacks. Choosing water as drink of choice and increasing purposeful exercise.

## 2022-07-27 ENCOUNTER — Emergency Department: Payer: BC Managed Care – PPO

## 2022-07-27 ENCOUNTER — Other Ambulatory Visit: Payer: Self-pay

## 2022-07-27 ENCOUNTER — Inpatient Hospital Stay
Admission: EM | Admit: 2022-07-27 | Discharge: 2022-07-30 | DRG: 392 | Disposition: A | Discharge status: Home / Self Care | Payer: BC Managed Care – PPO | Attending: Surgery | Admitting: Surgery

## 2022-07-27 DIAGNOSIS — K5720 Diverticulitis of large intestine with perforation and abscess without bleeding: Secondary | ICD-10-CM | POA: Diagnosis not present

## 2022-07-27 DIAGNOSIS — F419 Anxiety disorder, unspecified: Secondary | ICD-10-CM | POA: Diagnosis present

## 2022-07-27 DIAGNOSIS — K5900 Constipation, unspecified: Secondary | ICD-10-CM | POA: Diagnosis present

## 2022-07-27 DIAGNOSIS — K5700 Diverticulitis of small intestine with perforation and abscess without bleeding: Secondary | ICD-10-CM

## 2022-07-27 DIAGNOSIS — K5780 Diverticulitis of intestine, part unspecified, with perforation and abscess without bleeding: Secondary | ICD-10-CM | POA: Diagnosis present

## 2022-07-27 DIAGNOSIS — Z91018 Allergy to other foods: Secondary | ICD-10-CM

## 2022-07-27 DIAGNOSIS — Z79899 Other long term (current) drug therapy: Secondary | ICD-10-CM | POA: Diagnosis not present

## 2022-07-27 LAB — COMPREHENSIVE METABOLIC PANEL
ALT: 68 IU/L — ABNORMAL HIGH (ref 0–44)
ALT: 56 U/L — ABNORMAL HIGH (ref 0–44)
AST: 28 IU/L (ref 0–40)
AST: 26 U/L (ref 15–41)
Albumin/Globulin Ratio: 1.7 (ref 1.2–2.2)
Albumin: 4.5 g/dL (ref 4.1–5.1)
Albumin: 4.2 g/dL (ref 3.5–5.0)
Alkaline Phosphatase: 148 IU/L — ABNORMAL HIGH (ref 44–121)
Alkaline Phosphatase: 115 U/L (ref 38–126)
Anion gap: 13 (ref 5–15)
BUN/Creatinine Ratio: 12 (ref 9–20)
BUN: 12 mg/dL (ref 6–24)
BUN: 13 mg/dL (ref 6–20)
Bilirubin Total: 1 mg/dL (ref 0.0–1.2)
CO2: 20 mmol/L (ref 20–29)
CO2: 21 mmol/L — ABNORMAL LOW (ref 22–32)
Calcium: 9.6 mg/dL (ref 8.7–10.2)
Calcium: 9.3 mg/dL (ref 8.9–10.3)
Chloride: 100 mmol/L (ref 96–106)
Chloride: 104 mmol/L (ref 98–111)
Creatinine, Ser: 1.02 mg/dL (ref 0.76–1.27)
Creatinine, Ser: 0.81 mg/dL (ref 0.61–1.24)
GFR, Estimated: >60 mL/min (ref >60)
Globulin, Total: 2.6 g/dL (ref 1.5–4.5)
Glucose, Bld: 124 mg/dL — ABNORMAL HIGH (ref 70–99)
Glucose: 214 mg/dL — ABNORMAL HIGH (ref 70–99)
Potassium: 4.1 mmol/L (ref 3.5–5.2)
Potassium: 3.8 mmol/L (ref 3.5–5.1)
Sodium: 137 mmol/L (ref 134–144)
Sodium: 138 mmol/L (ref 135–145)
Total Bilirubin: 1.5 mg/dL — ABNORMAL HIGH (ref 0.3–1.2)
Total Protein: 7.1 g/dL (ref 6.0–8.5)
Total Protein: 8.1 g/dL (ref 6.5–8.1)
eGFR: 95 mL/min/{1.73_m2} (ref >59)

## 2022-07-27 LAB — TSH: TSH: 0.951 u[IU]/mL (ref 0.450–4.500)

## 2022-07-27 LAB — CBC WITH DIFFERENTIAL/PLATELET
Abs Immature Granulocytes: 0.03 10*3/uL (ref 0.00–0.07)
Basophils Absolute: 0 10*3/uL (ref 0.0–0.1)
Basophils Relative: 0 %
Eosinophils Absolute: 0.1 10*3/uL (ref 0.0–0.5)
Eosinophils Relative: 1 %
HCT: 43.9 % (ref 39.0–52.0)
Hemoglobin: 14.8 g/dL (ref 13.0–17.0)
Immature Granulocytes: 0 %
Lymphocytes Relative: 8 %
Lymphs Abs: 0.8 10*3/uL (ref 0.7–4.0)
MCH: 29.5 pg (ref 26.0–34.0)
MCHC: 33.7 g/dL (ref 30.0–36.0)
MCV: 87.6 fL (ref 80.0–100.0)
Monocytes Absolute: 0.9 10*3/uL (ref 0.1–1.0)
Monocytes Relative: 9 %
Neutro Abs: 8.3 10*3/uL — ABNORMAL HIGH (ref 1.7–7.7)
Neutrophils Relative %: 82 %
Platelets: 215 10*3/uL (ref 150–400)
RBC: 5.01 MIL/uL (ref 4.22–5.81)
RDW: 12.3 % (ref 11.5–15.5)
WBC: 10.1 10*3/uL (ref 4.0–10.5)
nRBC: 0 % (ref 0.0–0.2)

## 2022-07-27 LAB — HEPATITIS C ANTIBODY: Hep C Virus Ab: NONREACTIVE

## 2022-07-27 LAB — CBC
Hematocrit: 44.6 % (ref 37.5–51.0)
Hemoglobin: 15.4 g/dL (ref 13.0–17.7)
MCH: 29.9 pg (ref 26.6–33.0)
MCHC: 34.5 g/dL (ref 31.5–35.7)
MCV: 87 fL (ref 79–97)
Platelets: 216 10*3/uL (ref 150–450)
RBC: 5.15 x10E6/uL (ref 4.14–5.80)
RDW: 11.6 % (ref 11.6–15.4)
WBC: 9.6 10*3/uL (ref 3.4–10.8)

## 2022-07-27 LAB — PSA: Prostate Specific Ag, Serum: 0.8 ng/mL (ref 0.0–4.0)

## 2022-07-27 LAB — HIV ANTIBODY (ROUTINE TESTING W REFLEX): HIV Screen 4th Generation wRfx: NONREACTIVE

## 2022-07-27 LAB — LIPASE, BLOOD: Lipase: 33 U/L (ref 11–51)

## 2022-07-27 LAB — HEMOGLOBIN A1C
Est. average glucose Bld gHb Est-mCnc: 103 mg/dL
Hgb A1c MFr Bld: 5.2 % (ref 4.8–5.6)

## 2022-07-27 LAB — LIPID PANEL
Chol/HDL Ratio: 4.9 ratio (ref 0.0–5.0)
Cholesterol, Total: 181 mg/dL (ref 100–199)
HDL: 37 mg/dL — ABNORMAL LOW (ref >39)
LDL Chol Calc (NIH): 124 mg/dL — ABNORMAL HIGH (ref 0–99)
Triglycerides: 108 mg/dL (ref 0–149)
VLDL Cholesterol Cal: 20 mg/dL (ref 5–40)

## 2022-07-27 LAB — URINALYSIS, ROUTINE W REFLEX MICROSCOPIC
Bacteria, UA: NONE SEEN
Bilirubin Urine: NEGATIVE
Glucose, UA: NEGATIVE mg/dL
Hgb urine dipstick: NEGATIVE
Ketones, ur: 20 mg/dL — AB
Leukocytes,Ua: NEGATIVE
Nitrite: NEGATIVE
Protein, ur: 100 mg/dL — AB
Specific Gravity, Urine: 1.03 (ref 1.005–1.030)
Squamous Epithelial / HPF: NONE SEEN (ref 0–5)
pH: 5 (ref 5.0–8.0)

## 2022-07-27 MED ORDER — PIPERACILLIN-TAZOBACTAM 3.375 G IVPB
3.3750 g | Freq: Three times a day (TID) | INTRAVENOUS | Status: DC
  Administered 2022-07-27 – 2022-07-30 (×9): 3.375 g via INTRAVENOUS
  Filled 2022-07-27 (×10): qty 50

## 2022-07-27 MED ORDER — HYDROMORPHONE HCL 1 MG/ML IJ SOLN: 0.5000 mg | INTRAMUSCULAR | Status: DC | PRN

## 2022-07-27 MED ORDER — PANTOPRAZOLE SODIUM 40 MG IV SOLR
40.0000 mg | Freq: Every day | INTRAVENOUS | Status: DC
  Filled 2022-07-27: qty 10

## 2022-07-27 MED ORDER — LACTATED RINGERS IV SOLN: INTRAVENOUS | Status: DC

## 2022-07-27 MED ORDER — KETOROLAC TROMETHAMINE 30 MG/ML IJ SOLN
30.0000 mg | Freq: Four times a day (QID) | INTRAMUSCULAR | Status: DC
  Administered 2022-07-27 (×2): 30 mg via INTRAVENOUS
  Filled 2022-07-27 (×2): qty 1

## 2022-07-27 MED ORDER — ONDANSETRON HCL 4 MG/2ML IJ SOLN: 4.0000 mg | Freq: Four times a day (QID) | INTRAMUSCULAR | Status: DC | PRN

## 2022-07-27 MED ORDER — PIPERACILLIN-TAZOBACTAM 3.375 G IVPB 30 MIN: 3.3750 g | Freq: Once | INTRAVENOUS | Status: DC

## 2022-07-27 MED ORDER — ENOXAPARIN SODIUM 40 MG/0.4ML IJ SOSY: 40.0000 mg | PREFILLED_SYRINGE | INTRAMUSCULAR | Status: DC

## 2022-07-27 MED ORDER — ONDANSETRON 4 MG PO TBDP: 4.0000 mg | ORAL_TABLET | Freq: Four times a day (QID) | ORAL | Status: DC | PRN

## 2022-07-27 MED ORDER — ACETAMINOPHEN 500 MG PO TABS
1000.0000 mg | ORAL_TABLET | Freq: Four times a day (QID) | ORAL | Status: DC
  Filled 2022-07-27 (×2): qty 2

## 2022-07-27 MED ORDER — IOHEXOL 300 MG/ML  SOLN
100.0000 mL | Freq: Once | INTRAMUSCULAR | Status: AC | PRN
  Administered 2022-07-27: 100 mL via INTRAVENOUS

## 2022-07-27 MED ORDER — OXYCODONE HCL 5 MG PO TABS
5.0000 mg | ORAL_TABLET | ORAL | Status: DC | PRN
  Administered 2022-07-28 – 2022-07-29 (×3): 5 mg via ORAL
  Filled 2022-07-27 (×3): qty 1

## 2022-07-27 MED ORDER — POLYETHYLENE GLYCOL 3350 17 G PO PACK: 17.0000 g | PACK | Freq: Every day | ORAL | Status: DC | PRN

## 2022-07-27 NOTE — ED Triage Notes (Addendum)
Pt was sent by [facility] for r/u appendicitis- pt was also seen at primary care yesterday for the same- pt states that he has been having lower middle abd pain/groin pain- pt denies n/v/d, denies urinary symptoms- pt had blood work done at PCP yesterday- pt was started on miralax and has had regular BM

## 2022-07-27 NOTE — ED Notes (Signed)
Patient states he has not agreed to stay and wants to talk to the provider before he agrees to stay and additional labs and medications.

## 2022-07-27 NOTE — H&P (Signed)
Date of Admission:  07/27/2022  Reason for Admission:  Acute diverticulitis with abscess  History of Present Illness: Robert Castaneda is a 41 y.o. male presenting with 6 day history of lower abdominal pain.  Patient reports that on 12/10 he started having some lower abdominal pain and thought had constipation.  He took a laxative and it helped the pain.  However, it's been intermittently coming and going throughout the week.  It had become more severe at times and then after a bowel movement, the pain would improve, only to worsen again later on.  He has tried different laxatives.  He has seen his PCP's office twice this week as well and was given a prescription for Linzess on his last visit. This did help him a lot to have a lot of stool out and yesterday felt significantly better, but then started worsening again today.  He also resports having chills a He was sent to the ED from the walk in clinic.  In the ED, his labwork was overall unremarkable with normal WBC of 10.1 and normal Cr.  He had a CT scan of abdomen and pelvis which showed sigmoid diverticulitis with an abscess measuring 4.3 x 3.9 cm.  I have personally viewed the images, and the abscess appears to be intramural.  No free air.  Past Medical History: Past Medical History:  Diagnosis Date   Allergy    Anxiety    Social    Asthma      Past Surgical History: History reviewed. No pertinent surgical history.  Home Medications: Prior to Admission medications   Medication Sig Start Date End Date Taking? Authorizing Provider  linaclotide Karlene Einstein) 145 MCG CAPS capsule Take 1 capsule (145 mcg total) by mouth daily before breakfast. 07/26/22   Jacky Kindle, FNP    Allergies: Allergies  Allergen Reactions   Apple Juice Swelling   Carrot [Daucus Carota]     Pain     Social History:  reports that he has never smoked. He has never used smokeless tobacco. He reports current alcohol use of about 8.0 standard drinks of  alcohol per week. He reports that he does not use drugs.   Family History: Family History  Problem Relation Age of Onset   Arthritis Mother    Allergies Mother    Diabetes Father    Cancer Maternal Grandmother        breast   Alzheimer's disease Paternal Grandmother    Cancer Paternal Uncle        lung ans prostate    Review of Systems: Review of Systems  Constitutional:  Positive for chills.  HENT:  Negative for hearing loss.   Respiratory:  Negative for shortness of breath.   Cardiovascular:  Negative for chest pain.  Gastrointestinal:  Positive for abdominal pain and constipation. Negative for nausea and vomiting.  Genitourinary:  Negative for dysuria.  Musculoskeletal:  Negative for myalgias.  Skin:  Negative for rash.  Neurological:  Negative for dizziness.  Psychiatric/Behavioral:  Negative for depression.     Physical Exam BP (!) 160/96 (BP Location: Right Arm)   Pulse (!) 112   Temp 99 F (37.2 C) (Oral)   Resp 18   SpO2 99%  CONSTITUTIONAL: No acute distress, well nourished. HEENT:  Normocephalic, atraumatic, extraocular motion intact. NECK: Trachea is midline, and there is no jugular venous distension.  RESPIRATORY:  Normal respiratory effort without pathologic use of accessory muscles. CARDIOVASCULAR: Regular rhythm and rate. GI: The abdomen is soft, non-distended, with  mild tenderness in the lower abdomen / suprapubic area.  No peritonitis.  MUSCULOSKELETAL:  Normal muscle strength and tone in all four extremities.  No peripheral edema or cyanosis. SKIN: Skin turgor is normal. There are no pathologic skin lesions.  NEUROLOGIC:  Motor and sensation is grossly normal.  Cranial nerves are grossly intact. PSYCH:  Alert and oriented to person, place and time. Affect is normal.  Laboratory Analysis: Results for orders placed or performed during the hospital encounter of 07/27/22 (from the past 24 hour(s))  Urinalysis, Routine w reflex microscopic     Status:  Abnormal   Collection Time: 07/27/22 10:39 AM  Result Value Ref Range   Color, Urine AMBER (A) YELLOW   APPearance HAZY (A) CLEAR   Specific Gravity, Urine 1.030 1.005 - 1.030   pH 5.0 5.0 - 8.0   Glucose, UA NEGATIVE NEGATIVE mg/dL   Hgb urine dipstick NEGATIVE NEGATIVE   Bilirubin Urine NEGATIVE NEGATIVE   Ketones, ur 20 (A) NEGATIVE mg/dL   Protein, ur 948 (A) NEGATIVE mg/dL   Nitrite NEGATIVE NEGATIVE   Leukocytes,Ua NEGATIVE NEGATIVE   RBC / HPF 0-5 0 - 5 RBC/hpf   WBC, UA 0-5 0 - 5 WBC/hpf   Bacteria, UA NONE SEEN NONE SEEN   Squamous Epithelial / LPF NONE SEEN 0 - 5   Mucus PRESENT   CBC with Differential     Status: Abnormal   Collection Time: 07/27/22 11:40 AM  Result Value Ref Range   WBC 10.1 4.0 - 10.5 K/uL   RBC 5.01 4.22 - 5.81 MIL/uL   Hemoglobin 14.8 13.0 - 17.0 g/dL   HCT 54.6 27.0 - 35.0 %   MCV 87.6 80.0 - 100.0 fL   MCH 29.5 26.0 - 34.0 pg   MCHC 33.7 30.0 - 36.0 g/dL   RDW 09.3 81.8 - 29.9 %   Platelets 215 150 - 400 K/uL   nRBC 0.0 0.0 - 0.2 %   Neutrophils Relative % 82 %   Neutro Abs 8.3 (H) 1.7 - 7.7 K/uL   Lymphocytes Relative 8 %   Lymphs Abs 0.8 0.7 - 4.0 K/uL   Monocytes Relative 9 %   Monocytes Absolute 0.9 0.1 - 1.0 K/uL   Eosinophils Relative 1 %   Eosinophils Absolute 0.1 0.0 - 0.5 K/uL   Basophils Relative 0 %   Basophils Absolute 0.0 0.0 - 0.1 K/uL   Immature Granulocytes 0 %   Abs Immature Granulocytes 0.03 0.00 - 0.07 K/uL  Comprehensive metabolic panel     Status: Abnormal   Collection Time: 07/27/22 11:40 AM  Result Value Ref Range   Sodium 138 135 - 145 mmol/L   Potassium 3.8 3.5 - 5.1 mmol/L   Chloride 104 98 - 111 mmol/L   CO2 21 (L) 22 - 32 mmol/L   Glucose, Bld 124 (H) 70 - 99 mg/dL   BUN 13 6 - 20 mg/dL   Creatinine, Ser 3.71 0.61 - 1.24 mg/dL   Calcium 9.3 8.9 - 69.6 mg/dL   Total Protein 8.1 6.5 - 8.1 g/dL   Albumin 4.2 3.5 - 5.0 g/dL   AST 26 15 - 41 U/L   ALT 56 (H) 0 - 44 U/L   Alkaline Phosphatase 115 38 -  126 U/L   Total Bilirubin 1.5 (H) 0.3 - 1.2 mg/dL   GFR, Estimated >78 >93 mL/min   Anion gap 13 5 - 15  Lipase, blood     Status: None   Collection Time: 07/27/22  11:40 AM  Result Value Ref Range   Lipase 33 11 - 51 U/L    Imaging: CT Abdomen Pelvis W Contrast  Result Date: 07/27/2022 CLINICAL DATA:  Acute right lower quadrant pain. EXAM: CT ABDOMEN AND PELVIS WITH CONTRAST TECHNIQUE: Multidetector CT imaging of the abdomen and pelvis was performed using the standard protocol following bolus administration of intravenous contrast. RADIATION DOSE REDUCTION: This exam was performed according to the departmental dose-optimization program which includes automated exposure control, adjustment of the mA and/or kV according to patient size and/or use of iterative reconstruction technique. CONTRAST:  OMNIPAQUE IOHEXOL 300 MG/ML  SOLN COMPARISON:  None Available. FINDINGS: Lower Chest: No acute findings. Hepatobiliary: No hepatic masses identified. Gallbladder is unremarkable. No evidence of biliary ductal dilatation. Pancreas:  No mass or inflammatory changes. Spleen: Mild to moderate splenomegaly, measuring approximately 16 cm in length. No splenic masses identified. Adrenals/Urinary Tract: No suspicious masses identified. No evidence of ureteral calculi or hydronephrosis. Stomach/Bowel: Severe diverticulitis is seen involving the sigmoid colon. A diverticular abscess containing fluid and air bubbles is seen which measures 4.3 by 3.9 cm. No evidence of free intraperitoneal air. Vascular/Lymphatic: No pathologically enlarged lymph nodes. No acute vascular findings. Reproductive:  No mass or other significant abnormality. Other:  None. Musculoskeletal:  No suspicious bone lesions identified. IMPRESSION: Severe sigmoid diverticulitis, with 4.3 cm diverticular abscess. Mild to moderate splenomegaly. Electronically Signed   By: Danae Orleans M.D.   On: 07/27/2022 12:24   DG Abd 2 Views  Result Date:  07/27/2022 CLINICAL DATA:  Abdominal pain EXAM: ABDOMEN - 2 VIEW COMPARISON:  None Available. FINDINGS: The bowel gas pattern is normal. There is no evidence of free air. No radio-opaque calculi or other significant radiographic abnormality is seen. IMPRESSION: Negative. Electronically Signed   By: Layla Maw M.D.   On: 07/27/2022 11:28    Assessment and Plan: This is a 41 y.o. male with diverticulitis of sigmoid colon with abscess  --Discussed imaging with Dr. Archer Asa with Interventional Radiology.  Abscess is intramural and at this point would recommend conservative measures with IV antibiotic management, with repeat imaging in 2-3 days.  If no improvement, then may need drain placement, with risk of fistula formation.  I agree with that assessment.  The abscess does appear to be intramural.  I discussed the findings with the patient and the current plan for conservative management.  He's in agreement and all of his questions have been answered. --For now, will admit to surgical service.  Will keep NPO, with sips for meds only, IV fluids to keep hydrated, IV Zosyn, and appropriate pain control.  Will repeat CT scan on 12/18 to reassess.   I spent 75 minutes dedicated to the care of this patient on the date of this encounter to include pre-visit review of records, face-to-face time with the patient discussing diagnosis and management, and any post-visit coordination of care.   Howie Ill, MD [facility] Pg:  [phone]

## 2022-07-27 NOTE — ED Notes (Signed)
Discussed with Dr Vicente Males- no need for repeat blood work at this time

## 2022-07-27 NOTE — ED Provider Notes (Signed)
[facility] Provider Note  Patient Contact: 11:28 AM (approximate)   History   Abdominal Pain   HPI  Robert Castaneda is a 41 y.o. male with a history of asthma and anxiety, presents to the emergency department with suprapubic pain for the past 5 days.  Patient states that he sometimes gets relief after a bowel movement but not always.  He denies fever, chills, vomiting or diarrhea.  Patient states that he was referred by his PCP for an appendicitis rule out but has had no right lower quadrant abdominal pain.  No chest pain, chest tightness or shortness of breath.      Physical Exam   Triage Vital Signs: ED Triage Vitals  Enc Vitals Group     BP 07/27/22 1032 (!) 160/96     Pulse Rate 07/27/22 1030 (!) 112     Resp 07/27/22 1030 18     Temp 07/27/22 1030 99 F (37.2 C)     Temp Source 07/27/22 1030 Oral     SpO2 07/27/22 1030 99 %     Weight --      Height --      Head Circumference --      Peak Flow --      Pain Score 07/27/22 1030 9     Pain Loc --      Pain Edu? --      Excl. in GC? --     Most recent vital signs: Vitals:   07/27/22 1030 07/27/22 1032  BP:  (!) 160/96  Pulse: (!) 112   Resp: 18   Temp: 99 F (37.2 C)   SpO2: 99%      General: Alert and in no acute distress. Eyes:  PERRL. EOMI. Head: No acute traumatic findings ENT:      Nose: No congestion/rhinnorhea.      Mouth/Throat: Mucous membranes are moist. Neck: No stridor. No cervical spine tenderness to palpation. Cardiovascular:  Good peripheral perfusion Respiratory: Normal respiratory effort without tachypnea or retractions. Lungs CTAB. Good air entry to the bases with no decreased or absent breath sounds. Gastrointestinal: Bowel sounds 4 quadrants. Soft and nontender to palpation. No guarding or rigidity. No palpable masses. No distention. No CVA tenderness. Musculoskeletal: Full range of motion to all extremities.  Neurologic:  No gross focal neurologic  deficits are appreciated.  Skin:   No rash noted Other:   ED Results / Procedures / Treatments   Labs (all labs ordered are listed, but only abnormal results are displayed) Labs Reviewed  URINALYSIS, ROUTINE W REFLEX MICROSCOPIC - Abnormal; Notable for the following components:      Result Value   Color, Urine AMBER (*)    APPearance HAZY (*)    Ketones, ur 20 (*)    Protein, ur 100 (*)    All other components within normal limits  CBC WITH DIFFERENTIAL/PLATELET - Abnormal; Notable for the following components:   Neutro Abs 8.3 (*)    All other components within normal limits  COMPREHENSIVE METABOLIC PANEL - Abnormal; Notable for the following components:   CO2 21 (*)    Glucose, Bld 124 (*)    ALT 56 (*)    Total Bilirubin 1.5 (*)    All other components within normal limits  CULTURE, BLOOD (ROUTINE X 2)  CULTURE, BLOOD (ROUTINE X 2)  LIPASE, BLOOD      RADIOLOGY  I personally viewed and evaluated these images as part of my medical decision making, as well as  reviewing the written report by the radiologist.  ED Provider Interpretation: Patient has severe diverticulitis of the sigmoid colon with an associated 4.3 cm intramural abscess   PROCEDURES:  Critical Care performed: No  Procedures   MEDICATIONS ORDERED IN ED: Medications  lactated ringers infusion (has no administration in time range)  acetaminophen (TYLENOL) tablet 1,000 mg (has no administration in time range)  oxyCODONE (Oxy IR/ROXICODONE) immediate release tablet 5-10 mg (has no administration in time range)  HYDROmorphone (DILAUDID) injection 0.5 mg (has no administration in time range)  polyethylene glycol (MIRALAX / GLYCOLAX) packet 17 g (has no administration in time range)  ondansetron (ZOFRAN-ODT) disintegrating tablet 4 mg (has no administration in time range)    Or  ondansetron (ZOFRAN) injection 4 mg (has no administration in time range)  pantoprazole (PROTONIX) injection 40 mg (has no  administration in time range)  enoxaparin (LOVENOX) injection 40 mg (has no administration in time range)  piperacillin-tazobactam (ZOSYN) IVPB 3.375 g (has no administration in time range)  ketorolac (TORADOL) 30 MG/ML injection 30 mg (has no administration in time range)  iohexol (OMNIPAQUE) 300 MG/ML solution 100 mL (100 mLs Intravenous Contrast Given 07/27/22 1150)     IMPRESSION / MDM / ASSESSMENT AND PLAN / ED COURSE  I reviewed the triage vital signs and the nursing notes.                              Assessment and plan:  Abdominal pain:   41 year old male presents to the emergency department with suprapubic abdominal pain for the past 5 days.  Patient had low-grade fever at triage and was mildly tachycardic as well as hypertensive.  Vital signs were otherwise reassuring.  On exam, patient was alert and nontoxic-appearing with no tenderness or guarding to palpation.  Patient's white blood cell count was within range.  CMP reassuring.  CT abdomen and pelvis concerning for severe diverticulitis of the sigmoid colon with associated 4.3 cm intramural abscess.  I reached out to general surgeon on-call, Dr. Aleen Campi.  Manage appreciate trauma consult.  He recommended admission for IV antibiotics and consultation with IR.  I reached out to interventional radiology team, Dr. Archer Asa.  Very much appreciate time and consult.  Dr. Archer Asa anticipates patient needing IV antibiotics and repeat CT scan on Monday morning as he states that the current drain could cause erosion and fistula formation.  Patient was given IV Zosyn in the emergency department.  Patient admitted under the care of Dr. Aleen Campi FINAL CLINICAL IMPRESSION(S) / ED DIAGNOSES   Final diagnoses:  Diverticulitis of small intestine with abscess, unspecified bleeding status     Rx / DC Orders   ED Discharge Orders     None        Note:  This document was prepared using Dragon voice recognition software and may  include unintentional dictation errors.   Pia Mau [place], PA-C 07/27/22 1451    Georga Hacking, MD 07/27/22 1515

## 2022-07-28 DIAGNOSIS — K5720 Diverticulitis of large intestine with perforation and abscess without bleeding: Secondary | ICD-10-CM | POA: Diagnosis not present

## 2022-07-28 LAB — CBC
HCT: 40.3 % (ref 39.0–52.0)
Hemoglobin: 13.2 g/dL (ref 13.0–17.0)
MCH: 29.1 pg (ref 26.0–34.0)
MCHC: 32.8 g/dL (ref 30.0–36.0)
MCV: 88.8 fL (ref 80.0–100.0)
Platelets: 176 10*3/uL (ref 150–400)
RBC: 4.54 MIL/uL (ref 4.22–5.81)
RDW: 12.2 % (ref 11.5–15.5)
WBC: 6.2 10*3/uL (ref 4.0–10.5)
nRBC: 0 % (ref 0.0–0.2)

## 2022-07-28 LAB — MAGNESIUM: Magnesium: 2.3 mg/dL (ref 1.7–2.4)

## 2022-07-28 LAB — BASIC METABOLIC PANEL
Anion gap: 8 (ref 5–15)
BUN: 19 mg/dL (ref 6–20)
CO2: 22 mmol/L (ref 22–32)
Calcium: 8.4 mg/dL — ABNORMAL LOW (ref 8.9–10.3)
Chloride: 107 mmol/L (ref 98–111)
Creatinine, Ser: 0.85 mg/dL (ref 0.61–1.24)
GFR, Estimated: >60 mL/min (ref >60)
Glucose, Bld: 110 mg/dL — ABNORMAL HIGH (ref 70–99)
Potassium: 3.6 mmol/L (ref 3.5–5.1)
Sodium: 137 mmol/L (ref 135–145)

## 2022-07-28 MED ORDER — MELATONIN 5 MG PO TABS
2.5000 mg | ORAL_TABLET | Freq: Every day | ORAL | Status: DC
  Administered 2022-07-28 – 2022-07-29 (×2): 2.5 mg via ORAL
  Filled 2022-07-28 (×2): qty 1

## 2022-07-28 MED ORDER — MELATONIN 5 MG PO TABS: 2.5000 mg | ORAL_TABLET | Freq: Every day | ORAL | Status: DC

## 2022-07-28 MED ORDER — KETOROLAC TROMETHAMINE 30 MG/ML IJ SOLN
30.0000 mg | Freq: Four times a day (QID) | INTRAMUSCULAR | Status: DC
  Administered 2022-07-28 – 2022-07-30 (×10): 30 mg via INTRAVENOUS
  Filled 2022-07-28 (×10): qty 1

## 2022-07-28 NOTE — Progress Notes (Signed)
07/28/2022  Subjective: No acute events overnight.  For some reason he was given a regular diet order even though I had put n.p.o. on his admission.  I have corrected this this morning.  The patient reports that he is no longer having any pain.  Has had bowel movements already which went down more easily compared to prior.  WBC remains normal at 6.2.  Vital signs: Temp:  [97.5 F (36.4 C)-99 F (37.2 C)] 97.6 F (36.4 C) (12/17 0750) Pulse Rate:  [74-109] 74 (12/17 0750) Resp:  [16-19] 18 (12/17 0750) BP: (124-148)/(73-90) 124/73 (12/17 0750) SpO2:  [97 %-99 %] 99 % (12/17 0750) Weight:  [109.7 kg] 109.7 kg (12/16 1813)   Intake/Output: 12/16 0701 - 12/17 0700 In: 764.4 [I.V.:649.8; IV Piggyback:114.6] Out: -  Last BM Date : 07/27/22  Physical Exam: Constitutional: No acute distress Abdomen: Soft, nondistended, without any tenderness to palpation.  Labs:  Recent Labs    07/27/22 1140 07/28/22 0333  WBC 10.1 6.2  HGB 14.8 13.2  HCT 43.9 40.3  PLT 215 176   Recent Labs    07/27/22 1140 07/28/22 0333  NA 138 137  K 3.8 3.6  CL 104 107  CO2 21* 22  GLUCOSE 124* 110*  BUN 13 19  CREATININE 0.81 0.85  CALCIUM 9.3 8.4*   No results for input(s): "LABPROT", "INR" in the last 72 hours.  Imaging: CT Abdomen Pelvis W Contrast  Result Date: 07/27/2022 CLINICAL DATA:  Acute right lower quadrant pain. EXAM: CT ABDOMEN AND PELVIS WITH CONTRAST TECHNIQUE: Multidetector CT imaging of the abdomen and pelvis was performed using the standard protocol following bolus administration of intravenous contrast. RADIATION DOSE REDUCTION: This exam was performed according to the departmental dose-optimization program which includes automated exposure control, adjustment of the mA and/or kV according to patient size and/or use of iterative reconstruction technique. CONTRAST:  OMNIPAQUE IOHEXOL 300 MG/ML  SOLN COMPARISON:  None Available. FINDINGS: Lower Chest: No acute findings.  Hepatobiliary: No hepatic masses identified. Gallbladder is unremarkable. No evidence of biliary ductal dilatation. Pancreas:  No mass or inflammatory changes. Spleen: Mild to moderate splenomegaly, measuring approximately 16 cm in length. No splenic masses identified. Adrenals/Urinary Tract: No suspicious masses identified. No evidence of ureteral calculi or hydronephrosis. Stomach/Bowel: Severe diverticulitis is seen involving the sigmoid colon. A diverticular abscess containing fluid and air bubbles is seen which measures 4.3 by 3.9 cm. No evidence of free intraperitoneal air. Vascular/Lymphatic: No pathologically enlarged lymph nodes. No acute vascular findings. Reproductive:  No mass or other significant abnormality. Other:  None. Musculoskeletal:  No suspicious bone lesions identified. IMPRESSION: Severe sigmoid diverticulitis, with 4.3 cm diverticular abscess. Mild to moderate splenomegaly. Electronically Signed   By: Danae Orleans M.D.   On: 07/27/2022 12:24    Assessment/Plan: This is a 42 y.o. male with diverticulitis with intramural abscess.  - Patient's pain has resolved and his white blood cell count continues to be normal.  He has asked about advancing his diet again and we can start him on a clear liquid diet today. - Will order repeat CT scan for tomorrow morning to reevaluate his intramural abscess.  Suspect this will be improving given his clinical improvement.  If that is the case, then we will be able to continue to advance his diet again. - N.p.o. after midnight for his CT scan - Continue IV antibiotics, IV hydration.   I spent 35 minutes dedicated to the care of this patient on the date of this  encounter to include pre-visit review of records, face-to-face time with the patient discussing diagnosis and management, and any post-visit coordination of care.  Howie Ill, MD [facility]

## 2022-07-29 ENCOUNTER — Encounter: Payer: Self-pay | Admitting: Surgery

## 2022-07-29 ENCOUNTER — Inpatient Hospital Stay: Payer: BC Managed Care – PPO

## 2022-07-29 ENCOUNTER — Ambulatory Visit: Payer: BC Managed Care – PPO

## 2022-07-29 DIAGNOSIS — K5720 Diverticulitis of large intestine with perforation and abscess without bleeding: Secondary | ICD-10-CM | POA: Diagnosis not present

## 2022-07-29 LAB — CBC
HCT: 37.9 % — ABNORMAL LOW (ref 39.0–52.0)
Hemoglobin: 12.5 g/dL — ABNORMAL LOW (ref 13.0–17.0)
MCH: 29.1 pg (ref 26.0–34.0)
MCHC: 33 g/dL (ref 30.0–36.0)
MCV: 88.3 fL (ref 80.0–100.0)
Platelets: 185 10*3/uL (ref 150–400)
RBC: 4.29 MIL/uL (ref 4.22–5.81)
RDW: 12.3 % (ref 11.5–15.5)
WBC: 4.5 10*3/uL (ref 4.0–10.5)
nRBC: 0 % (ref 0.0–0.2)

## 2022-07-29 LAB — BASIC METABOLIC PANEL
Anion gap: 4 — ABNORMAL LOW (ref 5–15)
BUN: 12 mg/dL (ref 6–20)
CO2: 28 mmol/L (ref 22–32)
Calcium: 8.8 mg/dL — ABNORMAL LOW (ref 8.9–10.3)
Chloride: 107 mmol/L (ref 98–111)
Creatinine, Ser: 1.03 mg/dL (ref 0.61–1.24)
GFR, Estimated: >60 mL/min (ref >60)
Glucose, Bld: 101 mg/dL — ABNORMAL HIGH (ref 70–99)
Potassium: 3.7 mmol/L (ref 3.5–5.1)
Sodium: 139 mmol/L (ref 135–145)

## 2022-07-29 LAB — URINE CYTOLOGY ANCILLARY ONLY
Chlamydia: NEGATIVE
Comment: NEGATIVE
Comment: NEGATIVE
Comment: NORMAL
Neisseria Gonorrhea: NEGATIVE
Trichomonas: NEGATIVE

## 2022-07-29 LAB — MAGNESIUM: Magnesium: 2.4 mg/dL (ref 1.7–2.4)

## 2022-07-29 MED ORDER — IOHEXOL 9 MG/ML PO SOLN
500.0000 mL | ORAL | Status: AC
  Administered 2022-07-29 (×2): 500 mL via ORAL

## 2022-07-29 MED ORDER — IOHEXOL 350 MG/ML SOLN
100.0000 mL | Freq: Once | INTRAVENOUS | Status: AC | PRN
  Administered 2022-07-29: 100 mL via INTRAVENOUS

## 2022-07-29 NOTE — Progress Notes (Signed)
[facility] SURGICAL PROGRESS NOTE (cpt [phone])  Hospital Day(s): 2.   Interval History: Patient seen and examined, no acute events or new complaints overnight. Patient reports he is feeling better. Abdominal pain resolved. No fever, chills, nausea, emesis. Continues to be without leukocytosis; 4.5K. Hgb to 12.5; likely dilution. Renal function remains normal; sCr - 1.03; UO - unmeasured. No electrolyte derangements. Continues on Zosyn. NPO for planned follow up CT Abdomen/Pelvis. He is having bowel function.   Review of Systems:  Constitutional: denies fever, chills  HEENT: denies cough or congestion  Respiratory: denies any shortness of breath  Cardiovascular: denies chest pain or palpitations  Gastrointestinal: denies abdominal pain, N/V Genitourinary: denies burning with urination or urinary frequency Musculoskeletal: denies pain, decreased motor or sensation  Vital signs in last 24 hours: [min-max] current  Temp:  [97.6 F (36.4 C)-98 F (36.7 C)] 98 F (36.7 C) (12/17 1550) Pulse Rate:  [74-77] 76 (12/18 0450) Resp:  [17-18] 17 (12/18 0450) BP: (117-133)/(71-88) 117/71 (12/18 0450) SpO2:  [98 %-100 %] 100 % (12/18 0450)     Height: 6' (182.9 cm) Weight: 109.7 kg BMI (Calculated): 32.79   Intake/Output last 2 shifts:  12/17 0701 - 12/18 0700 In: 1827.9 [P.O.:1020; I.V.:725.6; IV Piggyback:82.2] Out: -    Physical Exam:  Constitutional: alert, cooperative and no distress; up at the sink HENT: normocephalic without obvious abnormality  Eyes: PERRL, EOM's grossly intact and symmetric  Respiratory: breathing non-labored at rest  Cardiovascular: regular rate and sinus rhythm  Gastrointestinal: Soft, non-tender, non-distended, no rebound/guarding. No rebound/guarding Musculoskeletal: no edema or wounds, motor and sensation grossly intact, NT    Labs:     Latest Ref Rng & Units 07/29/2022    3:50 AM 07/28/2022    3:33 AM 07/27/2022   11:40 AM  CBC  WBC  4.0 - 10.5 K/uL 4.5  6.2  10.1   Hemoglobin 13.0 - 17.0 g/dL 16.0  73.7  10.6   Hematocrit 39.0 - 52.0 % 37.9  40.3  43.9   Platelets 150 - 400 K/uL 185  176  215       Latest Ref Rng & Units 07/29/2022    3:50 AM 07/28/2022    3:33 AM 07/27/2022   11:40 AM  CMP  Glucose 70 - 99 mg/dL 269  485  462   BUN 6 - 20 mg/dL 12  19  13    Creatinine 0.61 - 1.24 mg/dL  7.03  5.00   Sodium 135 - 145 mmol/L 139  137  138   Potassium 3.5 - 5.1 mmol/L 3.7  3.6  3.8   Chloride 98 - 111 mmol/L 107  107  104   CO2 22 - 32 mmol/L 28  22  21    Calcium 8.9 - 10.3 mg/dL 8.8  8.4  9.3   Total Protein 6.5 - 8.1 g/dL   8.1   Total Bilirubin 0.3 - 1.2 mg/dL   1.5   Alkaline Phos 38 - 126 U/L   115   AST 15 - 41 U/L   26   ALT 0 - 44 U/L   56      Imaging studies: CT Abdomen/Pelvis pending   Assessment/Plan: (ICD-10's: K80.92) 41 y.o. male with diverticulitis with intramural abscess    - Pending CT Abdomen/Pelvis this morning to reassess intra-abdominal process / abscess    - Continue NPO for now. If CT reassuring, we can resume diet and start advancing    - Continue IV Abx (  Zosyn)   - Monitor abdominal examination - Pain control prn; antiemetics prn   - Mobilize   - Discharge Planning: Pending CT Abdomen/Pelvis today. If reassuring, will begin diet advancement and anticipate home tomorrow (12/19)  All of the above findings and recommendations were discussed with the patient, and the medical team, and all of patient's questions were answered to his expressed satisfaction.   -- Lynden Oxford, PA-C [facility] 07/29/2022, 7:24 AM M-F: 7am - 4pm

## 2022-07-30 DIAGNOSIS — K5720 Diverticulitis of large intestine with perforation and abscess without bleeding: Secondary | ICD-10-CM | POA: Diagnosis not present

## 2022-07-30 LAB — BASIC METABOLIC PANEL
Anion gap: 4 — ABNORMAL LOW (ref 5–15)
BUN: 8 mg/dL (ref 6–20)
CO2: 26 mmol/L (ref 22–32)
Calcium: 8.6 mg/dL — ABNORMAL LOW (ref 8.9–10.3)
Chloride: 109 mmol/L (ref 98–111)
Creatinine, Ser: 0.89 mg/dL (ref 0.61–1.24)
GFR, Estimated: >60 mL/min (ref >60)
Glucose, Bld: 103 mg/dL — ABNORMAL HIGH (ref 70–99)
Potassium: 3.7 mmol/L (ref 3.5–5.1)
Sodium: 139 mmol/L (ref 135–145)

## 2022-07-30 LAB — CBC
HCT: 39.5 % (ref 39.0–52.0)
Hemoglobin: 13.5 g/dL (ref 13.0–17.0)
MCH: 29.7 pg (ref 26.0–34.0)
MCHC: 34.2 g/dL (ref 30.0–36.0)
MCV: 86.8 fL (ref 80.0–100.0)
Platelets: 216 10*3/uL (ref 150–400)
RBC: 4.55 MIL/uL (ref 4.22–5.81)
RDW: 12.1 % (ref 11.5–15.5)
WBC: 3.5 10*3/uL — ABNORMAL LOW (ref 4.0–10.5)
nRBC: 0 % (ref 0.0–0.2)

## 2022-07-30 LAB — URINE CULTURE: Organism ID, Bacteria: NO GROWTH

## 2022-07-30 MED ORDER — AMOXICILLIN-POT CLAVULANATE 875-125 MG PO TABS: 1.0000 | ORAL_TABLET | Freq: Two times a day (BID) | ORAL | 0 refills | Status: AC

## 2022-07-30 NOTE — Progress Notes (Signed)
Normal abdominal xray; diverticulitis episode unseen at that time.

## 2022-07-30 NOTE — Discharge Summary (Signed)
[facility] SURGICAL DISCHARGE SUMMARY (cpt: [phone])  Patient ID: Shinichi Anguiano MRN: [id number] DOB/AGE: Jul 13, 1981 41 y.o.  Admit date: 07/27/2022 Discharge date: 07/30/2022  Discharge Diagnoses Patient Active Problem List   Diagnosis Date Noted   Diverticulitis of intestine with abscess 07/27/2022    Consultants None  Procedures None  HPI: Gerron Guidotti is a 41 y.o. male presenting with 6 day history of lower abdominal pain.  Patient reports that on 12/10 he started having some lower abdominal pain and thought had constipation.  He took a laxative and it helped the pain.  However, it's been intermittently coming and going throughout the week.  It had become more severe at times and then after a bowel movement, the pain would improve, only to worsen again later on.  He has tried different laxatives.  He has seen his PCP's office twice this week as well and was given a prescription for Linzess on his last visit. This did help him a lot to have a lot of stool out and yesterday felt significantly better, but then started worsening again today.  He also resports having chills a He was sent to the ED from the walk in clinic.  In the ED, his labwork was overall unremarkable with normal WBC of 10.1 and normal Cr.  He had a CT scan of abdomen and pelvis which showed sigmoid diverticulitis with an abscess measuring 4.3 x 3.9 cm.  I have personally viewed the images, and the abscess appears to be intramural.  No free air.   Hospital Course: Patient was admitted to the general surgery service and initiated conservative measures. He had good progression almost immediately. He did undergo repeat CT Abdomen/Pelvis on 12/18 which showed improvement in abscess but small locule of contained air. He continued to do well clinically without fever and resolution in leukocytosis. Advancement of patient's diet was well-tolerated. The remainder of patient's hospital course was essentially  unremarkable, and discharge planning was initiated accordingly with patient safely able to be discharged home with appropriate discharge instructions, antibiotics (Augmentin x11 days to complete 14 total), pain control, and outpatient follow-up after all of his questions were answered to his expressed satisfaction.   Discharge Condition: Good   Physical Examination:  Constitutional: alert, cooperative and no distress; up at the sink HENT: normocephalic without obvious abnormality  Eyes: PERRL, EOM's grossly intact and symmetric  Respiratory: breathing non-labored at rest  Cardiovascular: regular rate and sinus rhythm  Gastrointestinal: Soft, non-tender, non-distended, no rebound/guarding. No rebound/guarding Musculoskeletal: no edema or wounds, motor and sensation grossly intact, NT    Allergies as of 07/30/2022       Reactions   Apple Juice Swelling   Carrot [daucus Carota]    Pain        Medication List     TAKE these medications    amoxicillin-clavulanate 875-125 MG tablet Commonly known as: AUGMENTIN Take 1 tablet by mouth 2 (two) times daily for 11 days.   ascorbic acid 500 MG tablet Commonly known as: VITAMIN C Take 250 mg by mouth daily.   clonazePAM 0.5 MG tablet Commonly known as: KLONOPIN Take 0.5 mg by mouth 2 (two) times daily as needed for anxiety.   linaclotide 145 MCG Caps capsule Commonly known as: Linzess Take 1 capsule (145 mcg total) by mouth daily before breakfast.   omeprazole 40 MG capsule Commonly known as: PRILOSEC Take 40 mg by mouth daily.          Follow-up Information  Henrene Dodge, MD Follow up in 3 week(s).   Specialty: General Surgery Why: Hospital follow up for diverticulitis Contact information: [address] [place] [address] [phone]                  Time spent on discharge management including discussion of hospital course, clinical condition, outpatient instructions,  prescriptions, and follow up with the patient and members of the medical team: >30 minutes  -- Lynden Oxford , PA-C [facility]  07/30/2022, 8:40 AM [phone] M-F: 7am - 4pm

## 2022-07-30 NOTE — Discharge Instructions (Signed)
In addition to included general post-operative instructions,  Diet: Recommend following low fiber diet for ~2 weeks after discharge.Hand out was given regarding this. After that time, transition to high fiber diet to help limit risk of recurrence. Handout also provided for this. Recommend avoiding constipation as well and maintaining adequate hydration    Activity: No restrictions  Medications: Resume all home medications. Complete antibiotic course as prescribed.  Call office [phone]) at any time if any questions, worsening pain, fevers/chills

## 2022-07-30 NOTE — TOC CM/SW Note (Signed)
  Transition of Care [facility]) Screening Note   Patient Details  Name: Robert Castaneda Date of Birth: 1980-11-10   Transition of Care [facility]) CM/SW Contact:    Margarito Liner, LCSW Phone Number: 07/30/2022, 8:19 AM    Transition of Care Department [facility]) has reviewed patient and no TOC needs have been identified at this time. We will continue to monitor patient advancement through interdisciplinary progression rounds. If new patient transition needs arise, please place a TOC consult.

## 2022-07-30 NOTE — Progress Notes (Signed)
Labs were relatively stable despite what turned out to be a localized diverticular abscess. Slight elevation in liver enzymes and bad/LDL cholesterol. Stroke and MI risk relatively low at 5% in 10 years. I continue to recommend diet low in saturated fat and regular exercise - 30 min at least 5 times per week  The 10-year ASCVD risk score (Arnett DK, et al., 2019) is: 4.4%   Values used to calculate the score:     Age: 41 years     Sex: Male     Is Non-Hispanic African American: No     Diabetic: No     Tobacco smoker: Yes     Systolic Blood Pressure: 123 mmHg     Is BP treated: No     HDL Cholesterol: 37 mg/dL     Total Cholesterol: 181 mg/dL

## 2022-08-01 LAB — CULTURE, BLOOD (ROUTINE X 2)
Culture: NO GROWTH
Culture: NO GROWTH
Special Requests: ADEQUATE
Special Requests: ADEQUATE

## 2022-08-19 ENCOUNTER — Encounter: Payer: Self-pay | Admitting: Surgery

## 2022-08-19 ENCOUNTER — Ambulatory Visit: Payer: BC Managed Care – PPO | Admitting: Surgery

## 2022-08-19 VITALS — BP 149/95 | HR 88 | Temp 98.0°F | Ht 72.0 in | Wt 241.0 lb

## 2022-08-19 DIAGNOSIS — K5720 Diverticulitis of large intestine with perforation and abscess without bleeding: Secondary | ICD-10-CM

## 2022-08-19 NOTE — Patient Instructions (Addendum)
We have sent a referral to [facility] for you to have a colonoscopy near the beginning of March. They will call you to get this set up.   We will have you follow up here for a recheck after this is done. We will send you a letter about this appointment.   You may go back to your normal diet with a focus on fiber. Be sure to drink plenty of fluids with the fiber.

## 2022-08-19 NOTE — Progress Notes (Signed)
  08/19/2022  History of Present Illness: Robert Castaneda is a 42 y.o. male presenting for follow up of perforated diverticulitis.  He was admitted on 12/16 with diverticulitis of sigmoid colon with intramural abscess.  He did not require a drain and was discharged home on 07/30/22.  He reports that since discharge, he has not had any abdominal issues.  Denies any low abdominal pain or pressure, denies any further constipation issues.    Past Medical History: Past Medical History:  Diagnosis Date   Allergy    Anxiety    Social    Asthma      Past Surgical History: History reviewed. No pertinent surgical history.  Home Medications: Prior to Admission medications   Medication Sig Start Date End Date Taking? Authorizing Provider  ascorbic acid (VITAMIN C) 500 MG tablet Take 250 mg by mouth daily.   Yes [provider]  clonazePAM (KLONOPIN) 0.5 MG tablet Take 0.5 mg by mouth 2 (two) times daily as needed for anxiety. 03/25/22  Yes [provider]  omeprazole (PRILOSEC) 40 MG capsule Take 40 mg by mouth daily. 10/11/20  Yes [provider]    Allergies: Allergies  Allergen Reactions   Apple Juice Swelling   Carrot [Daucus Carota]     Pain     Review of Systems: Review of Systems  Constitutional:  Negative for chills and fever.  Respiratory:  Negative for shortness of breath.   Cardiovascular:  Negative for chest pain.  Gastrointestinal:  Negative for abdominal pain, nausea and vomiting.  Skin:  Negative for rash.    Physical Exam BP (!) 149/95   Pulse 88   Temp 98 F (36.7 C)   Ht 6' (1.829 m)   Wt 241 lb (109.3 kg)   SpO2 98%   BMI 32.69 kg/m  CONSTITUTIONAL: No acute distress, well nourished. HEENT:  Normocephalic, atraumatic, extraocular motion intact. RESPIRATORY:  Normal respiratory effort without pathologic use of accessory muscles. CARDIOVASCULAR: Regular rhythm and rate. GI: The abdomen is soft, non-distended, non-tender to  palpation.  NEUROLOGIC:  Motor and sensation is grossly normal.  Cranial nerves are grossly intact. PSYCH:  Alert and oriented to person, place and time. Affect is normal.  Labs/Imaging: CT abdomen/pelvis on 07/27/22: IMPRESSION: Severe sigmoid diverticulitis, with 4.3 cm diverticular abscess.   Mild to moderate splenomegaly.  Assessment and Plan: This is a 42 y.o. male with history of perforated diverticulitis with intramural abscess.  --The patient continues to do well and improved remarkably well during his hospital stay.  Denies any abdominal pain, nausea, or vomiting.  Recommended that he start a high fiber diet and increase fluid intake to help keep the stool soft with less constipation or straining to decrease the pressure within the sigmoid colon. --Will send a referral to GI for colonoscopy in early March. --Discussed with him that this was a complicated episode of diverticulitis.  Would recommend surgical management in the future, but we will discuss further once his colonoscopy is done. --Follow up with me after colonoscopy.  I spent 30 minutes dedicated to the care of this patient on the date of this encounter to include pre-visit review of records, face-to-face time with the patient discussing diagnosis and management, and any post-visit coordination of care.   Melvyn Neth, [facility]

## 2022-10-10 ENCOUNTER — Ambulatory Visit: Payer: BC Managed Care – PPO | Admitting: Gastroenterology

## 2022-10-10 ENCOUNTER — Encounter: Payer: Self-pay | Admitting: Gastroenterology

## 2022-10-10 ENCOUNTER — Other Ambulatory Visit: Payer: Self-pay

## 2022-10-10 VITALS — BP 147/85 | HR 58 | Temp 98.3°F | Ht 72.0 in | Wt 241.2 lb

## 2022-10-10 DIAGNOSIS — Z8719 Personal history of other diseases of the digestive system: Secondary | ICD-10-CM

## 2022-10-10 DIAGNOSIS — R12 Heartburn: Secondary | ICD-10-CM | POA: Insufficient documentation

## 2022-10-10 DIAGNOSIS — K5720 Diverticulitis of large intestine with perforation and abscess without bleeding: Secondary | ICD-10-CM

## 2022-10-10 MED ORDER — NA SULFATE-K SULFATE-MG SULF 17.5-3.13-1.6 GM/177ML PO SOLN: 354.0000 mL | Freq: Once | ORAL | 0 refills | Status: AC

## 2022-10-10 NOTE — Progress Notes (Signed)
Cephas Darby, MD [address]  [place]  [place], [place] [address]  Main: [phone]  Fax: [phone]    Gastroenterology Consultation  Referring Provider:     Gwyneth Sprout, FNP Primary Care Physician:  Gwyneth Sprout, FNP Primary Gastroenterologist:  Dr. Cephas Darby Reason for Consultation: History of perforated sigmoid diverticulitis        HPI:   Robert Castaneda is a 42 y.o. male referred by  Gwyneth Sprout, FNP  for consultation & management of perforated sigmoid diverticulitis with intramural abscess.  Patient was admitted to [facility] on 12/16 secondary to severe suprapubic discomfort, CT confirmed perforated diverticulitis of sigmoid colon with intramural abscess, treated with antibiotics, did not require any drain placement.  He was discharged on 12/19 after repeat CT scan with improvement in the size of the abscess.  Patient is closely followed by Dr. Hampton Abbot, had a posthospital follow-up appointment on 1/8 and referred to GI to discuss about colonoscopy before considering sigmoid colectomy. Patient reports making significant changes in his eating habits, significantly cut back on red meat, trying to avoid processed foods, cut back on alcohol use as well He denies any abdominal pain, diarrhea, rectal bleeding or change in bowel habits.  He has incorporated more fiber in his diet  Patient also reports burning in his throat when he consumes fresh apple or fresh carrot or celery.  He has been taking over-the-counter Prilosec as needed for heartburn.  He tried apple cider vinegar and resulted in swelling of his face.  He denies any difficulty swallowing or atypical chest pain or spasm.  NSAIDs: None  Antiplts/Anticoagulants/Anti thrombotics: None  GI Procedures: None  He denies any family history of GI malignancy  Past Medical History:  Diagnosis Date   Allergy    Anxiety    Social    Asthma     History reviewed. No pertinent surgical  history.   Current Outpatient Medications:    ascorbic acid (VITAMIN C) 500 MG tablet, Take 250 mg by mouth daily., Disp: , Rfl:    clonazePAM (KLONOPIN) 0.5 MG tablet, Take 0.5 mg by mouth 2 (two) times daily as needed for anxiety., Disp: , Rfl:    Na Sulfate-K Sulfate-Mg Sulf 17.5-3.13-1.6 GM/177ML SOLN, Take 354 mLs by mouth once for 1 dose., Disp: 354 mL, Rfl: 0   omeprazole (PRILOSEC) 20 MG capsule, Take 20 mg by mouth as needed., Disp: , Rfl:    Family History  Problem Relation Age of Onset   Arthritis Mother    Allergies Mother    Diabetes Father    Cancer Maternal Grandmother        breast   Alzheimer's disease Paternal Grandmother    Cancer Paternal Uncle        lung ans prostate     Social History   Tobacco Use   Smoking status: Never    Passive exposure: Never   Smokeless tobacco: Never  Vaping Use   Vaping Use: Never used  Substance Use Topics   Alcohol use: Yes    Alcohol/week: 8.0 standard drinks of alcohol    Types: 8 Cans of beer per week    Comment: on the weekends occasionally   Drug use: No    Allergies as of 10/10/2022 - Review Complete 10/10/2022  Allergen Reaction Noted   Apple juice Swelling 11/20/2017   Carrot [daucus carota]  11/20/2017    Review of Systems:    All systems reviewed and negative except  where noted in HPI.   Physical Exam:  BP (!) 147/85 (BP Location: Left Arm, Patient Position: Sitting, Cuff Size: Normal)   Pulse (!) 58   Temp 98.3 F (36.8 C) (Oral)   Ht 6' (1.829 m)   Wt 241 lb 4 oz (109.4 kg)   BMI 32.72 kg/m  No LMP for male patient.  General:   Alert,  Well-developed, well-nourished, pleasant and cooperative in NAD Head:  Normocephalic and atraumatic. Eyes:  Sclera clear, no icterus.   Conjunctiva pink. Ears:  Normal auditory acuity. Nose:  No deformity, discharge, or lesions. Mouth:  No deformity or lesions,oropharynx pink & moist. Neck:  Supple; no masses or thyromegaly. Lungs:  Respirations even and  unlabored.  Clear throughout to auscultation.   No wheezes, crackles, or rhonchi. No acute distress. Heart:  Regular rate and rhythm; no murmurs, clicks, rubs, or gallops. Abdomen:  Normal bowel sounds. Soft, non-tender and non-distended without masses, hepatosplenomegaly or hernias noted.  No guarding or rebound tenderness.   Rectal: Not performed Msk:  Symmetrical without gross deformities. Good, equal movement & strength bilaterally. Pulses:  Normal pulses noted. Extremities:  No clubbing or edema.  No cyanosis. Neurologic:  Alert and oriented x3;  grossly normal neurologically. Skin:  Intact without significant lesions or rashes. No jaundice. Psych:  Alert and cooperative. Normal mood and affect.  Imaging Studies: Reviewed  Assessment and Plan:   Robert Castaneda is a 42 y.o. pleasant Caucasian male with no significant past medical history, history of anxiety, intermittent heartburn to certain foods is seen in consultation to discuss about colonoscopy after recent episode of perforated sigmoid diverticulitis with intramural abscess s/p treatment with antibiotics  Symptoms have completely resolved Proceed with diagnostic colonoscopy to rule out any space-occupying lesions Encouraged to continue high-fiber diet, minimal intake of processed foods and red meat, cut back on drinking alcohol Incorporate physical activity at least 3 to 5 days a week  Intermittent heartburn Advised patient to call our office back if his heartburn is progressive requiring antireflux medication on a regular basis, then we can proceed with upper endoscopy for further evaluation   Follow up as needed   Cephas Darby, MD

## 2022-10-16 ENCOUNTER — Encounter: Payer: Self-pay | Admitting: Gastroenterology

## 2022-10-22 ENCOUNTER — Ambulatory Visit: Payer: BC Managed Care – PPO | Admitting: Anesthesiology

## 2022-10-22 ENCOUNTER — Ambulatory Visit
Admission: RE | Admit: 2022-10-22 | Discharge: 2022-10-22 | Disposition: A | Discharge status: Home / Self Care | Payer: BC Managed Care – PPO | Attending: Gastroenterology | Admitting: Gastroenterology

## 2022-10-22 ENCOUNTER — Other Ambulatory Visit: Payer: Self-pay

## 2022-10-22 ENCOUNTER — Encounter: Payer: Self-pay | Admitting: Gastroenterology

## 2022-10-22 ENCOUNTER — Encounter
Admission: RE | Disposition: A | Discharge status: Home / Self Care | Payer: Self-pay | Source: Home / Self Care | Attending: Gastroenterology

## 2022-10-22 DIAGNOSIS — K219 Gastro-esophageal reflux disease without esophagitis: Secondary | ICD-10-CM | POA: Insufficient documentation

## 2022-10-22 DIAGNOSIS — Z09 Encounter for follow-up examination after completed treatment for conditions other than malignant neoplasm: Secondary | ICD-10-CM | POA: Insufficient documentation

## 2022-10-22 DIAGNOSIS — K635 Polyp of colon: Secondary | ICD-10-CM

## 2022-10-22 DIAGNOSIS — K644 Residual hemorrhoidal skin tags: Secondary | ICD-10-CM | POA: Insufficient documentation

## 2022-10-22 DIAGNOSIS — Z8719 Personal history of other diseases of the digestive system: Secondary | ICD-10-CM

## 2022-10-22 DIAGNOSIS — K5730 Diverticulosis of large intestine without perforation or abscess without bleeding: Secondary | ICD-10-CM | POA: Insufficient documentation

## 2022-10-22 DIAGNOSIS — Z87891 Personal history of nicotine dependence: Secondary | ICD-10-CM | POA: Diagnosis not present

## 2022-10-22 HISTORY — PX: POLYPECTOMY: SHX5525

## 2022-10-22 HISTORY — DX: Presence of spectacles and contact lenses: Z97.3

## 2022-10-22 HISTORY — DX: Gastro-esophageal reflux disease without esophagitis: K21.9

## 2022-10-22 HISTORY — PX: COLONOSCOPY WITH PROPOFOL: SHX5780

## 2022-10-22 SURGERY — COLONOSCOPY WITH PROPOFOL
Anesthesia: General | Site: Rectum

## 2022-10-22 MED ORDER — LIDOCAINE HCL (CARDIAC) PF 100 MG/5ML IV SOSY
PREFILLED_SYRINGE | INTRAVENOUS | Status: DC | PRN
  Administered 2022-10-22: 40 mg via INTRAVENOUS

## 2022-10-22 MED ORDER — STERILE WATER FOR IRRIGATION IR SOLN
Status: DC | PRN
  Administered 2022-10-22: 120 mL

## 2022-10-22 MED ORDER — LACTATED RINGERS IV SOLN: INTRAVENOUS | Status: DC

## 2022-10-22 MED ORDER — PROPOFOL 10 MG/ML IV BOLUS
INTRAVENOUS | Status: DC | PRN
  Administered 2022-10-22 (×2): 50 mg via INTRAVENOUS
  Administered 2022-10-22: 100 mg via INTRAVENOUS

## 2022-10-22 MED ORDER — STERILE WATER FOR IRRIGATION IR SOLN
Status: DC | PRN
  Administered 2022-10-22: 1

## 2022-10-22 MED ORDER — PROPOFOL 500 MG/50ML IV EMUL
INTRAVENOUS | Status: DC | PRN
  Administered 2022-10-22: 100 ug/kg/min via INTRAVENOUS

## 2022-10-22 MED ORDER — SODIUM CHLORIDE 0.9 % IV SOLN: INTRAVENOUS | Status: DC

## 2022-10-22 SURGICAL SUPPLY — 25 items
CLIP HMST 235XBRD CATH ROT (MISCELLANEOUS) IMPLANT
CLIP RESOLUTION 360 11X235 (MISCELLANEOUS)
ELECT REM PT RETURN 9FT ADLT (ELECTROSURGICAL)
ELECTRODE REM PT RTRN 9FT ADLT (ELECTROSURGICAL) IMPLANT
FCP ESCP3.2XJMB 240X2.8X (MISCELLANEOUS)
FORCEPS BIOP RAD 4 LRG CAP 4 (CUTTING FORCEPS) IMPLANT
FORCEPS BIOP RJ4 240 W/NDL (MISCELLANEOUS)
FORCEPS ESCP3.2XJMB 240X2.8X (MISCELLANEOUS) IMPLANT
GOWN CVR UNV OPN BCK APRN NK (MISCELLANEOUS) ×4 IMPLANT
GOWN ISOL THUMB LOOP REG UNIV (MISCELLANEOUS) ×4
INJECTOR VARIJECT VIN23 (MISCELLANEOUS) IMPLANT
KIT DEFENDO VALVE AND CONN (KITS) IMPLANT
KIT PRC NS LF DISP ENDO (KITS) ×2 IMPLANT
KIT PROCEDURE OLYMPUS (KITS) ×2
MANIFOLD NEPTUNE II (INSTRUMENTS) ×2 IMPLANT
MARKER SPOT ENDO TATTOO 5ML (MISCELLANEOUS) IMPLANT
PROBE APC STR FIRE (PROBE) IMPLANT
RETRIEVER NET ROTH 2.5X230 LF (MISCELLANEOUS) IMPLANT
SNARE COLD EXACTO (MISCELLANEOUS) IMPLANT
SNARE SHORT THROW 13M SML OVAL (MISCELLANEOUS) IMPLANT
SNARE SHORT THROW 30M LRG OVAL (MISCELLANEOUS) IMPLANT
SNARE SNG USE RND 15MM (INSTRUMENTS) IMPLANT
TRAP ETRAP POLY (MISCELLANEOUS) IMPLANT
VARIJECT INJECTOR VIN23 (MISCELLANEOUS)
WATER STERILE IRR 250ML POUR (IV SOLUTION) ×2 IMPLANT

## 2022-10-22 NOTE — Op Note (Signed)
[facility] Patient Name: Robert Castaneda Procedure Date: 10/22/2022 11:16 AM MRN: [id number] Account #: [id number] Date of Birth: 11/22/1980 Admit Type: Outpatient Age: 42 Room: [facility] OR ROOM 01 Gender: Male Note Status: Finalized Instrument Name: Peds [id number] Procedure:             Colonoscopy Indications:           This is the patient's first colonoscopy, Follow-up of                         diverticulitis Providers:             Lin Landsman MD, MD Referring MD:          Jaci Standard. Rollene Rotunda (Referring MD) Medicines:             General Anesthesia Complications:         No immediate complications. Estimated blood loss: None. Procedure:             Pre-Anesthesia Assessment:                        - Prior to the procedure, a History and Physical was                         performed, and patient medications and allergies were                         reviewed. The patient is competent. The risks and                         benefits of the procedure and the sedation options and                         risks were discussed with the patient. All questions                         were answered and informed consent was obtained.                         Patient identification and proposed procedure were                         verified by the physician, the nurse, the                         anesthesiologist, the anesthetist and the technician                         in the pre-procedure area in the procedure room in the                         endoscopy suite. Mental Status Examination: alert and                         oriented. Airway Examination: normal oropharyngeal                         airway and neck mobility. Respiratory Examination:  clear to auscultation. CV Examination: normal.                         Prophylactic Antibiotics: The patient does not require                         prophylactic antibiotics. Prior  Anticoagulants: The                         patient has taken no anticoagulant or antiplatelet                         agents. ASA Grade Assessment: II - A patient with mild                         systemic disease. After reviewing the risks and                         benefits, the patient was deemed in satisfactory                         condition to undergo the procedure. The anesthesia                         plan was to use general anesthesia. Immediately prior                         to administration of medications, the patient was                         re-assessed for adequacy to receive sedatives. The                         heart rate, respiratory rate, oxygen saturations,                         blood pressure, adequacy of pulmonary ventilation, and                         response to care were monitored throughout the                         procedure. The physical status of the patient was                         re-assessed after the procedure.                        After obtaining informed consent, the colonoscope was                         passed under direct vision. Throughout the procedure,                         the patient's blood pressure, pulse, and oxygen                         saturations were monitored continuously. The was  introduced through the anus and advanced to the 10 cm                         into the ileum. The colonoscopy was performed without                         difficulty. The patient tolerated the procedure well.                         The quality of the bowel preparation was evaluated                         using the BBPS [facility] Bowel Preparation Scale) with                         scores of: Right Colon = 3, Transverse Colon = 3 and                         Left Colon = 3 (entire mucosa seen well with no                         residual staining, small fragments of stool or opaque                         liquid).  The total BBPS score equals 9. The terminal                         ileum, ileocecal valve, appendiceal orifice, and                         rectum were photographed. Findings:      The perianal and digital rectal examinations were normal. Pertinent       negatives include normal sphincter tone and no palpable rectal lesions.      The terminal ileum appeared normal.      A 5 mm polyp was found in the descending colon. The polyp was sessile.       The polyp was removed with a cold snare. Resection and retrieval were       complete. Estimated blood loss: none.      Multiple diverticula were found in the sigmoid colon.      Non-bleeding external hemorrhoids were found during retroflexion. The       hemorrhoids were medium-sized.      The exam was otherwise without abnormality. Impression:            - The examined portion of the ileum was normal.                        - One 5 mm polyp in the descending colon, removed with                         a cold snare. Resected and retrieved.                        - Diverticulosis in the sigmoid colon.                        -  Non-bleeding external hemorrhoids.                        - The examination was otherwise normal. Recommendation:        - Discharge patient to home (with escort).                        - Resume previous diet today.                        - Continue present medications.                        - Await pathology results.                        - Repeat colonoscopy in 5-10 years for surveillance                         based on pathology results.                        - High fiber diet indefinitely. Procedure Code(s):     --- Professional ---                        [phone], Colonoscopy, flexible; with removal of                         tumor(s), polyp(s), or other lesion(s) by snare                         technique Diagnosis Code(s):     --- Professional ---                        K64.4, Residual hemorrhoidal skin tags                         D12.4, Benign neoplasm of descending colon                        K57.32, Diverticulitis of large intestine without                         perforation or abscess without bleeding                        K57.30, Diverticulosis of large intestine without                         perforation or abscess without bleeding CPT copyright 2022 American Medical Association. All rights reserved. The codes documented in this report are preliminary and upon coder review may  be revised to meet current compliance requirements. Dr. Ulyess Mort Lin Landsman MD, MD 10/22/2022 11:51:38 AM This report has been signed electronically. Number of Addenda: 0 Note Initiated On: 10/22/2022 11:16 AM Scope Withdrawal Time: 0 hours 12 minutes 39 seconds  Total Procedure Duration: 0 hours 14 minutes 11 seconds  Estimated Blood Loss:  Estimated blood loss: none.      [facility]

## 2022-10-22 NOTE — Anesthesia Postprocedure Evaluation (Signed)
Anesthesia Post Note  Patient: Robert Castaneda  Procedure(s) Performed: COLONOSCOPY WITH PROPOFOL (Rectum) POLYPECTOMY (Rectum)  Patient location during evaluation: PACU Anesthesia Type: General Level of consciousness: awake and alert Pain management: pain level controlled Vital Signs Assessment: post-procedure vital signs reviewed and stable Respiratory status: spontaneous breathing, nonlabored ventilation, respiratory function stable and patient connected to nasal cannula oxygen Cardiovascular status: blood pressure returned to baseline and stable Postop Assessment: no apparent nausea or vomiting Anesthetic complications: no  No notable events documented.   Last Vitals:  Vitals:   10/22/22 1154 10/22/22 1202  BP: 126/84 (!) 124/91  Pulse: 91 84  Resp: 14 14  Temp: 36.4 C 36.4 C  SpO2: 100% 96%    Last Pain:  Vitals:   10/22/22 1202  TempSrc:   PainSc: 0-No pain                 Dimas Millin

## 2022-10-22 NOTE — Anesthesia Preprocedure Evaluation (Signed)
Anesthesia Evaluation  Patient identified by MRN, date of birth, ID band Patient awake    Reviewed: Allergy & Precautions, NPO status , Patient's Chart, lab work & pertinent test results  Airway Mallampati: III  TM Distance: >3 FB Neck ROM: full    Dental  (+) Chipped, Dental Advidsory Given   Pulmonary asthma , former smoker   Pulmonary exam normal        Cardiovascular negative cardio ROS Normal cardiovascular exam     Neuro/Psych negative neurological ROS  negative psych ROS   GI/Hepatic negative GI ROS, Neg liver ROS,,,  Endo/Other  negative endocrine ROS    Renal/GU negative Renal ROS  negative genitourinary   Musculoskeletal   Abdominal   Peds  Hematology negative hematology ROS (+)   Anesthesia Other Findings Past Medical History: No date: Allergy No date: Anxiety     Comment:  Social  No date: Asthma No date: GERD (gastroesophageal reflux disease) No date: Wears contact lenses  History reviewed. No pertinent surgical history.  BMI    Body Mass Index: 32.12 kg/m      Reproductive/Obstetrics negative OB ROS                             Anesthesia Physical Anesthesia Plan  ASA: 2  Anesthesia Plan: General   Post-op Pain Management: Minimal or no pain anticipated   Induction: Intravenous  PONV Risk Score and Plan: 3 and Propofol infusion, TIVA and Ondansetron  Airway Management Planned: Nasal Cannula  Additional Equipment: None  Intra-op Plan:   Post-operative Plan:   Informed Consent: I have reviewed the patients History and Physical, chart, labs and discussed the procedure including the risks, benefits and alternatives for the proposed anesthesia with the patient or authorized representative who has indicated his/her understanding and acceptance.     Dental advisory given  Plan Discussed with: CRNA and Surgeon  Anesthesia Plan Comments: (Discussed risks  of anesthesia with patient, including possibility of difficulty with spontaneous ventilation under anesthesia necessitating airway intervention, PONV, and rare risks such as cardiac or respiratory or neurological events, and allergic reactions. Discussed the role of CRNA in patient's perioperative care. Patient understands.)       Anesthesia Quick Evaluation

## 2022-10-22 NOTE — Transfer of Care (Signed)
Immediate Anesthesia Transfer of Care Note  Patient: Robert Castaneda  Procedure(s) Performed: COLONOSCOPY WITH PROPOFOL (Rectum) POLYPECTOMY (Rectum)  Patient Location: PACU  Anesthesia Type: General  Level of Consciousness: awake, alert  and patient cooperative  Airway and Oxygen Therapy: Patient Spontanous Breathing and Patient connected to supplemental oxygen  Post-op Assessment: Post-op Vital signs reviewed, Patient's Cardiovascular Status Stable, Respiratory Function Stable, Patent Airway and No signs of Nausea or vomiting  Post-op Vital Signs: Reviewed and stable  Complications: No notable events documented.

## 2022-10-22 NOTE — H&P (Signed)
Robert Darby, MD [address]  [place]  [place], [place] [address]  Main: [phone]  Fax: [phone] Pager: [phone]  Primary Care Physician:  Gwyneth Sprout, FNP Primary Gastroenterologist:  Dr. Cephas Castaneda  Pre-Procedure History & Physical: HPI:  Robert Castaneda is a 42 y.o. male is here for an colonoscopy.   Past Medical History:  Diagnosis Date   Allergy    Anxiety    Social    Asthma    GERD (gastroesophageal reflux disease)    Wears contact lenses     History reviewed. No pertinent surgical history.  Prior to Admission medications   Medication Sig Start Date End Date Taking? Authorizing Provider  ascorbic acid (VITAMIN C) 500 MG tablet Take 250 mg by mouth daily.   Yes [provider]  cetirizine (ZYRTEC) 10 MG tablet Take 10 mg by mouth daily as needed for allergies.   Yes [provider]  omeprazole (PRILOSEC) 20 MG capsule Take 20 mg by mouth as needed.   Yes [provider]  psyllium (METAMUCIL) 58.6 % packet Take 1 packet by mouth daily.   Yes [provider]  clonazePAM (KLONOPIN) 0.5 MG tablet Take 0.5 mg by mouth 2 (two) times daily as needed for anxiety. Patient not taking: Reported on 10/16/2022 03/25/22   [provider]    Allergies as of 10/10/2022 - Review Complete 10/10/2022  Allergen Reaction Noted   Apple juice Swelling 11/20/2017   Carrot [daucus carota]  11/20/2017    Family History  Problem Relation Age of Onset   Arthritis Mother    Allergies Mother    Diabetes Father    Cancer Maternal Grandmother        breast   Alzheimer's disease Paternal Grandmother    Cancer Paternal Uncle        lung ans prostate    Social History   Socioeconomic History   Marital status: Married    Spouse name: Not on file   Number of children: Not on file   Years of education: Not on file   Highest education level: Not on file  Occupational History   Not on file  Tobacco Use   Smoking  status: Former    Types: Cigarettes    Quit date: 2005    Years since quitting: 19.2    Passive exposure: Never   Smokeless tobacco: Never   Tobacco comments:    Rare cigar  Vaping Use   Vaping Use: Never used  Substance and Sexual Activity   Alcohol use: Yes    Alcohol/week: 6.0 standard drinks of alcohol    Types: 6 Cans of beer per week    Comment: on the weekends occasionally   Drug use: No   Sexual activity: Yes  Other Topics Concern   Not on file  Social History Narrative   Not on file   Social Determinants of Health   Financial Resource Strain: Not on file  Food Insecurity: No Food Insecurity (07/27/2022)   Hunger Vital Sign    Worried About Running Out of Food in the Last Year: Never true    Ran Out of Food in the Last Year: Never true  Transportation Needs: No Transportation Needs (07/27/2022)   PRAPARE - Hydrologist (Medical): No    Lack of Transportation (Non-Medical): No  Physical Activity: Not on file  Stress: Not on file  Social Connections: Not on file  Intimate Partner Violence: Not At Risk (  07/27/2022)   Humiliation, Afraid, Rape, and Kick questionnaire    Fear of Current or Ex-Partner: No    Emotionally Abused: No    Physically Abused: No    Sexually Abused: No    Review of Systems: See HPI, otherwise negative ROS  Physical Exam: BP 135/89   Temp 99 F (37.2 C) (Tympanic)   Resp 12   Ht 6' 0.01" (1.829 m)   Wt 107.5 kg   SpO2 98%   BMI 32.12 kg/m  General:   Alert,  pleasant and cooperative in NAD Head:  Normocephalic and atraumatic. Neck:  Supple; no masses or thyromegaly. Lungs:  Clear throughout to auscultation.    Heart:  Regular rate and rhythm. Abdomen:  Soft, nontender and nondistended. Normal bowel sounds, without guarding, and without rebound.   Neurologic:  Alert and  oriented x4;  grossly normal neurologically.  Impression/Plan: Robert Castaneda is here for an colonoscopy to be performed for  h/o sigmoid diverticulitis  Risks, benefits, limitations, and alternatives regarding  colonoscopy have been reviewed with the patient.  Questions have been answered.  All parties agreeable.   Sherri Sear, MD  10/22/2022, 10:22 AM

## 2022-10-22 NOTE — Anesthesia Procedure Notes (Signed)
Procedure Name: General with mask airway Date/Time: 10/22/2022 11:28 AM  Performed by: Hilbert Odor, CRNAPre-anesthesia Checklist: Patient identified, Emergency Drugs available, Suction available, Patient being monitored and Timeout performed Patient Re-evaluated:Patient Re-evaluated prior to induction Oxygen Delivery Method: Nasal cannula Preoxygenation: Pre-oxygenation with 100% oxygen Induction Type: IV induction

## 2022-10-23 ENCOUNTER — Encounter: Payer: Self-pay | Admitting: Gastroenterology

## 2022-10-24 ENCOUNTER — Encounter: Payer: Self-pay | Admitting: Gastroenterology

## 2022-10-24 LAB — SURGICAL PATHOLOGY

## 2022-11-25 ENCOUNTER — Ambulatory Visit: Payer: BC Managed Care – PPO | Admitting: Surgery

## 2022-12-06 ENCOUNTER — Encounter: Payer: Self-pay | Admitting: Surgery

## 2022-12-06 ENCOUNTER — Ambulatory Visit: Payer: BC Managed Care – PPO | Admitting: Surgery

## 2022-12-06 VITALS — BP 150/95 | HR 98 | Temp 98.0°F | Ht 72.0 in | Wt 245.0 lb

## 2022-12-06 DIAGNOSIS — K5720 Diverticulitis of large intestine with perforation and abscess without bleeding: Secondary | ICD-10-CM | POA: Diagnosis not present

## 2022-12-06 NOTE — Progress Notes (Signed)
12/06/2022  History of Present Illness: Robert Castaneda is a 42 y.o. male presenting for follow-up of perforated diverticulitis with intramural abscess.  He was admitted to the hospital on 07/27/2022 for this.  He was discharged home on 07/30/2022.  He had his colonoscopy on 10/22/2022 which showed a small polyp in the descending colon and otherwise sigmoid diverticula but no other concerning findings.  Today, the patient reports that he has been doing very well.  He has made a lot of dietary changes and now is eating less of the fast food/junk food type of meals and is cooking more and eating higher fiber content.  He takes Metamucil daily.  Denies any constipation like he had before and also denies any abdominal pain.  Past Medical History: Past Medical History:  Diagnosis Date   Allergy    Anxiety    Social    Asthma    GERD (gastroesophageal reflux disease)    Wears contact lenses      Past Surgical History: Past Surgical History:  Procedure Laterality Date   COLONOSCOPY WITH PROPOFOL N/A 10/22/2022   Procedure: COLONOSCOPY WITH PROPOFOL;  Surgeon: Toney Reil, MD;  Location: [facility];  Service: Endoscopy;  Laterality: N/A;   POLYPECTOMY  10/22/2022   Procedure: POLYPECTOMY;  Surgeon: Toney Reil, MD;  Location: [facility];  Service: Endoscopy;;    Home Medications: Prior to Admission medications   Medication Sig Start Date End Date Taking? Authorizing Provider  ascorbic acid (VITAMIN C) 500 MG tablet Take 250 mg by mouth daily.   Yes [provider]  cetirizine (ZYRTEC) 10 MG tablet Take 10 mg by mouth daily as needed for allergies.   Yes [provider]  psyllium (METAMUCIL) 58.6 % packet Take 1 packet by mouth daily.   Yes [provider]  omeprazole (PRILOSEC) 20 MG capsule Take 20 mg by mouth as needed. Patient not taking: Reported on 12/06/2022    [provider]    Allergies: Allergies  Allergen  Reactions   Apple Juice Swelling    Raw apples - esophageal pain   Carrot [Daucus Carota]     Raw carrots - Esophageal Pain     Review of Systems: Review of Systems  Constitutional:  Negative for chills and fever.  Respiratory:  Negative for shortness of breath.   Cardiovascular:  Negative for chest pain.  Gastrointestinal:  Negative for abdominal pain, nausea and vomiting.    Physical Exam BP (!) 150/95   Pulse 98   Temp 98 F (36.7 C)   Ht 6' (1.829 m)   Wt 245 lb (111.1 kg)   SpO2 99%   BMI 33.23 kg/m  CONSTITUTIONAL: No acute distress, well-nourished HEENT:  Normocephalic, atraumatic, extraocular motion intact. RESPIRATORY:  Normal respiratory effort without pathologic use of accessory muscles. CARDIOVASCULAR: Regular rhythm and rate. GI: The abdomen is soft, nondistended, nontender to palpation. NEUROLOGIC:  Motor and sensation is grossly normal.  Cranial nerves are grossly intact. PSYCH:  Alert and oriented to person, place and time. Affect is normal.  Labs/Imaging: Colonoscopy on 10/22/2022: IMPRESSION: - The examined portion of the ileum was normal.  - One 5 mm polyp in the descending colon, removed with a cold snare. Resected and retrieved.  - Diverticulosis in the sigmoid colon.  - Non- bleeding external hemorrhoids.  - The examination was otherwise normal.  Assessment and Plan: This is a 42 y.o. male with history of perforated diverticulitis with intramural abscess.  - The patient is  doing significantly better and has made dietary changes and adding high-fiber to his diet and has not had any issues with constipation or with abdominal discomfort.  Discussed with the patient that his appearance of diverticulitis is negative consider a complicated episode given the abscess and the recommendation will be for proceeding with sigmoidectomy.  However discussed with the patient also there is no heart failure through send that he has to have surgery based on these  findings.  Now this is more of a discussion with the patient and case-by-case basis.  I think at this point, the patient is doing very well and given his lifestyle changes, I think would be appropriate to do watchful waiting with him.  Discussed with him that if he starts noticing any worsening issues or discomfort issues that he should let us know.  I think also if he were to be having recurrent episodes we will be pushing more for surgery.  However for now I think it is appropriate to monitor his symptoms. - Patient understands this plan all of his questions have been answered.  He will keep [place] posted and call us of any questions or concerns.  I spent 20 minutes dedicated to the care of this patient on the date of this encounter to include pre-visit review of records, face-to-face time with the patient discussing diagnosis and management, and any post-visit coordination of care.   Howie Ill, MD [facility]

## 2022-12-06 NOTE — Patient Instructions (Signed)
Continue to take daily fiber supplements.   Follow-up with our office as needed.  Please call and ask to speak with a nurse if you develop questions or concerns.   Diverticulosis  Diverticulosis is when small pouches called diverticula form in the wall of the colon. The colon is where water is absorbed. It is also where poop (stool) is formed. The pouches form when the inside layer of the colon pushes through weak spots in the outer layers of the colon. You may have a few pouches or many of them. In most cases, the pouches do not cause problems. If they become inflamed or infected, you may have a condition called diverticulitis. What are the causes? The cause of this condition is not known. What increases the risk? You are more likely to get this condition if: You are older than 41 years of age. You do not eat enough fiber or you get constipated a lot. You are overweight. You do not get enough exercise. You smoke. You take over-the-counter pain medicines. You have a family history of the condition. What are the signs or symptoms? In most people, there are no symptoms. If you do have symptoms, they may include: Bloating. Stomach cramps. Constipation or diarrhea. Pain in the lower left side of your abdomen. How is this diagnosed? This condition is often diagnosed during an exam for other colon problems. It may be diagnosed when you have: A colonoscopy. This is when a tube with a camera on the end is used to look at your colon. A barium enema. This is an X-ray exam that uses dye to look at your colon. A CT scan. How is this treated? You may not need treatment. Your health care provider will tell you what you can do at home to help prevent problems. You may need treatment if you have symptoms or if you have had diverticulitis before. You may be told to: Eat a high-fiber diet. Take medicine to relax your colon. Lose weight. Follow these instructions at home: Medicines Take  over-the-counter and prescription medicines only as told by your provider. If told, take a fiber supplement or probiotic. Managing constipation Your condition may cause constipation. To prevent or treat constipation, you may need to: Drink enough fluid to keep your pee (urine) pale yellow. Take over-the-counter or prescription medicines. Eat foods that are high in fiber, such as beans, whole grains, and fresh fruits and vegetables. Limit foods that are high in fat and processed sugars, such as fried or sweet foods. Try not to strain when you poop. Contact a health care provider if: Your symptoms get worse all of a sudden. You have pain in your abdomen that gets worse. You have bloating or stomach cramps. You continue to have frequent constipation. You have a fever or chills. You vomit. Your poop is bloody, black, or tarry. This information is not intended to replace advice given to you by your health care provider. Make sure you discuss any questions you have with your health care provider. Document Revised: 04/25/2022 Document Reviewed: 04/25/2022 Elsevier Patient Education  2023 [employer].
